# Patient Record
Sex: Male | Born: 1935
Health system: Southern US, Community
[De-identification: ages and names within clinical notes are randomized; demographics above are authoritative.]

## PROBLEM LIST (undated history)

## (undated) DIAGNOSIS — K254 Chronic or unspecified gastric ulcer with hemorrhage: Secondary | ICD-10-CM

## (undated) DIAGNOSIS — C61 Malignant neoplasm of prostate: Secondary | ICD-10-CM

## (undated) DIAGNOSIS — H9190 Unspecified hearing loss, unspecified ear: Secondary | ICD-10-CM

## (undated) HISTORY — PX: BACK SURGERY: SHX140

## (undated) HISTORY — PX: LUMBAR DISC SURGERY: SHX700

---

## 1999-07-13 ENCOUNTER — Emergency Department (HOSPITAL_COMMUNITY): Admission: EM | Admit: 1999-07-13 | Discharge: 1999-07-13 | Payer: Self-pay | Admitting: Emergency Medicine

## 2004-05-25 ENCOUNTER — Ambulatory Visit: Payer: Self-pay

## 2004-06-05 HISTORY — PX: PROSTATECTOMY: SHX69

## 2005-07-24 ENCOUNTER — Ambulatory Visit: Payer: Self-pay | Admitting: Urology

## 2005-07-24 IMAGING — NM NUCLEAR MEDICINE WHOLE BODY BONE SCINTIGRAPHY
2 series · 6 of 6 positions shown · non-contrast
Comparison: none

REASON FOR EXAM: Prostate CA
COMMENTS:

[Series 1: 3 hr wholebody · 2.40mm/px · 2 of 2 frames shown]
[frame 1/2]
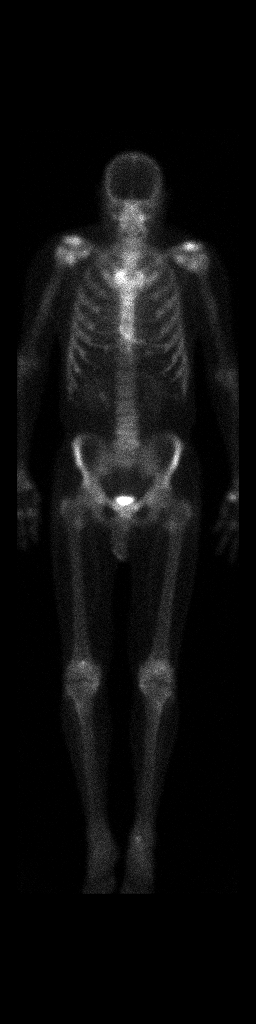
[frame 2/2]
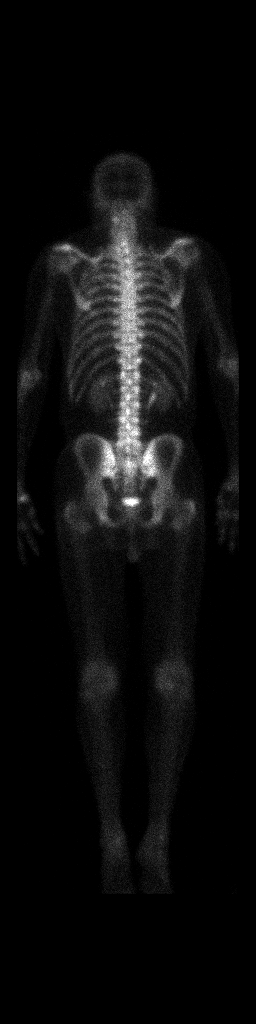

[Series 1: bone statics · 2.40mm/px · 2 acquisitions, 4 frames shown]
[im 1/2]
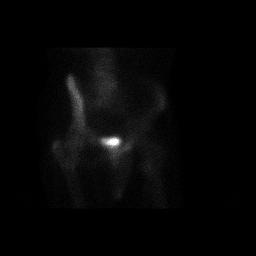
[im 1/2]
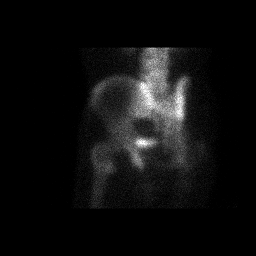
[im 2/2]
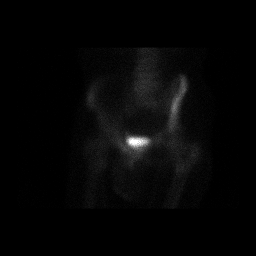
[im 2/2]
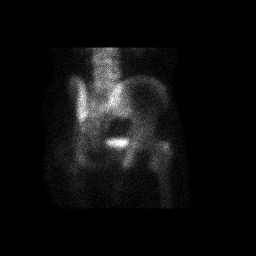

[6 of 6 positions shown; findings below may reference images not displayed]

PROCEDURE:     NM  - NM BONE WB 3 HR [DATE]  [DATE]

RESULT:     A total body bone scan was performed status post intravenous
administration of 21.77 mCi Tc 99m MDP.  Appropriate radiotracer activity is
demonstrated in the region of the RIGHT and LEFT kidneys and urinary
bladder.  Very vague areas of increased osseous remodeling are demonstrated
along the medial malleolar region of the LEFT ankle as well as along the
patellar region of the RIGHT knee.  These areas likely represent the sequela
of degenerative change though can be monitored if and as clinically
warranted. No further evidence of asymmetric osseous remodeling is
demonstrated within the appendicular or axial skeleton.
IMPRESSION: Vague areas of osseous remodeling projecting in the region of the patella on
the RIGHT and within the medial malleolar region of the ankle on the LEFT.
These likely represent the sequela of degenerative change though can be
monitored if and as clinically warranted.

## 2005-08-01 ENCOUNTER — Other Ambulatory Visit: Payer: Self-pay

## 2005-08-07 ENCOUNTER — Ambulatory Visit: Payer: Self-pay | Admitting: Urology

## 2007-02-12 ENCOUNTER — Ambulatory Visit: Payer: Self-pay | Admitting: Gastroenterology

## 2012-06-05 DIAGNOSIS — C61 Malignant neoplasm of prostate: Secondary | ICD-10-CM

## 2012-06-05 HISTORY — DX: Malignant neoplasm of prostate: C61

## 2013-01-24 ENCOUNTER — Ambulatory Visit: Payer: Self-pay | Admitting: Gastroenterology

## 2013-01-27 LAB — PATHOLOGY REPORT

## 2013-03-03 ENCOUNTER — Ambulatory Visit (HOSPITAL_COMMUNITY)
Admission: RE | Admit: 2013-03-03 | Discharge: 2013-03-03 | Disposition: A | Discharge status: Home / Self Care | Payer: Medicare Other | Source: Ambulatory Visit | Attending: Neurosurgery | Admitting: Neurosurgery

## 2013-03-03 ENCOUNTER — Other Ambulatory Visit (HOSPITAL_COMMUNITY): Payer: Self-pay | Admitting: Neurosurgery

## 2013-03-03 DIAGNOSIS — M5412 Radiculopathy, cervical region: Secondary | ICD-10-CM

## 2013-03-03 DIAGNOSIS — M5380 Other specified dorsopathies, site unspecified: Secondary | ICD-10-CM | POA: Insufficient documentation

## 2013-03-03 DIAGNOSIS — R531 Weakness: Secondary | ICD-10-CM

## 2013-03-03 DIAGNOSIS — R29898 Other symptoms and signs involving the musculoskeletal system: Secondary | ICD-10-CM | POA: Insufficient documentation

## 2013-03-10 ENCOUNTER — Other Ambulatory Visit: Payer: Self-pay | Admitting: Neurosurgery

## 2013-03-13 ENCOUNTER — Encounter (HOSPITAL_COMMUNITY): Payer: Self-pay | Admitting: Pharmacy Technician

## 2013-03-14 NOTE — Pre-Procedure Instructions (Signed)
Michael Stone  03/14/2013   Your procedure is scheduled on: Wednesday, October 15th.  Report to [facility], Main Entrance/ntrance "A"at 6:30  Call this number if you have problems the morning of surgery: [phone]   Remember:   Do not eat food or drink liquids after midnight Tuesday, October 14th.  Take these medicines the morning of surgery with A SIP OF WATER: take if needed:diazepam (VALIUM), oxyCODONE-acetaminophen (PERCOCET/ROXICET).  Stop taking Aspirin, Coumadin, Plavix, Effient and Herbal medications.  Do not take any NSAIDs ie: Ibuprofen,  Advil,Naproxen or any medication containing Aspirin.   Do not wear jewelry, make-up or nail polish.  Do not wear lotions, powders, or perfumes. You may wear deodorant.  Do not shave 48 hours prior to surgery. Men may shave face and neck.  Do not bring valuables to the hospital.  [facility] is not responsible for any belongings or valuables.               Contacts, dentures or bridgework may not be worn into surgery.  Leave suitcase in the car. After surgery it may be brought to your room.  For patients admitted to the hospital, discharge time is determined by your treatment team.               Patients discharged the day of surgery will not be allowed to drive home.  Name and phone number of your driver: - `  Special Instructions: Shower using CHG 2 nights before surgery and the night before surgery.  If you shower the day of surgery use CHG.  Use special wash - you have one bottle of CHG for all showers.  You should use approximately 1/3 of the bottle for each shower.   Please read over the following fact sheets that you were given: Pain Booklet, Coughing and Deep Breathing and Surgical Site Infection Prevention

## 2013-03-17 ENCOUNTER — Encounter (HOSPITAL_COMMUNITY)
Admission: RE | Admit: 2013-03-17 | Discharge: 2013-03-17 | Disposition: A | Discharge status: Home / Self Care | Payer: Medicare Other | Source: Ambulatory Visit | Attending: Neurosurgery | Admitting: Neurosurgery

## 2013-03-17 ENCOUNTER — Encounter (HOSPITAL_COMMUNITY): Payer: Self-pay

## 2013-03-17 LAB — BASIC METABOLIC PANEL
CO2: 31 mEq/L (ref 19–32)
Chloride: 99 mEq/L (ref 96–112)
Creatinine, Ser: 1.13 mg/dL (ref 0.50–1.35)
GFR calc Af Amer: 71 mL/min — ABNORMAL LOW (ref >90)
Glucose, Bld: 90 mg/dL (ref 70–99)
Potassium: 3.7 mEq/L (ref 3.5–5.1)
Sodium: 138 mEq/L (ref 135–145)

## 2013-03-17 LAB — SURGICAL PCR SCREEN
MRSA, PCR: POSITIVE — AB
Staphylococcus aureus: POSITIVE — AB

## 2013-03-17 LAB — CBC
Hemoglobin: 15.2 g/dL (ref 13.0–17.0)
Platelets: 353 10*3/uL (ref 150–400)
RDW: 12.4 % (ref 11.5–15.5)
WBC: 8.6 10*3/uL (ref 4.0–10.5)

## 2013-03-17 NOTE — Progress Notes (Signed)
Notified susan at dr. Cassandria Santee office regarding positive nasal swab for mrsa/staph. rx to be called in to [facility].

## 2013-03-18 MED ORDER — CEFAZOLIN SODIUM-DEXTROSE 2-3 GM-% IV SOLR
2.0000 g | INTRAVENOUS | Status: AC
  Administered 2013-03-19: 2 g via INTRAVENOUS
  Filled 2013-03-18: qty 50

## 2013-03-18 NOTE — H&P (Signed)
Michael Stone is an 77 y.o. male.   Chief Complaint: right arm pain HPI: patient seen in my office with the history of neck pain with radiation to the right arm associated with weakness.he has had this for several years and usually goes away with traction.  Past Medical History  Diagnosis Date  . Cancer     prostate    Past Surgical History  Procedure Laterality Date  . Back surgery      lumbar surgery  . Prostatectomy  2006    No family history on file. Social History:  reports that he quit smoking about 30 years ago. His smoking use included Pipe. He has never used smokeless tobacco. He reports that he does not drink alcohol or use illicit drugs.  Allergies: No Known Allergies  No prescriptions prior to admission    Results for orders placed during the hospital encounter of 03/17/13 (from the past 48 hour(s))  SURGICAL PCR SCREEN     Status: Abnormal   Collection Time    03/17/13 10:00 AM      Result Value Range   MRSA, PCR POSITIVE (*) NEGATIVE   Staphylococcus aureus POSITIVE (*) NEGATIVE   Comment:            The Xpert SA Assay (FDA     approved for NASAL specimens     in patients over 54 years of age),     is one component of     a comprehensive surveillance     program.  Test performance has     been validated by [employer] for patients greater     than or equal to 75 year old.     It is not intended     to diagnose infection nor to     guide or monitor treatment.  CBC     Status: None   Collection Time    03/17/13 10:00 AM      Result Value Range   WBC 8.6  4.0 - 10.5 K/uL   RBC 5.16  4.22 - 5.81 MIL/uL   Hemoglobin 15.2  13.0 - 17.0 g/dL   HCT 95.6  21.3 - 08.6 %   MCV 86.4  78.0 - 100.0 fL   MCH 29.5  26.0 - 34.0 pg   MCHC 34.1  30.0 - 36.0 g/dL   RDW 57.8  46.9 - 62.9 %   Platelets 353  150 - 400 K/uL  BASIC METABOLIC PANEL     Status: Abnormal   Collection Time    03/17/13 10:00 AM      Result Value Range   Sodium 138  135 - 145 mEq/L   Potassium 3.7  3.5 - 5.1 mEq/L   Chloride 99  96 - 112 mEq/L   CO2 31  19 - 32 mEq/L   Glucose, Bld 90  70 - 99 mg/dL   BUN 15  6 - 23 mg/dL   Creatinine, Ser 5.28  0.50 - 1.35 mg/dL   Calcium 9.3  8.4 - 41.3 mg/dL   GFR calc non Af Amer 62 (*) >90 mL/min   GFR calc Af Amer 71 (*) >90 mL/min   Comment: (NOTE)     The eGFR has been calculated using the CKD EPI equation.     This calculation has not been validated in all clinical situations.     eGFR's persistently <90 mL/min signify possible Chronic Kidney     Disease.   No results found.  Review of Systems  Constitutional: Negative.   HENT: Positive for hearing loss.   Eyes: Negative.   Respiratory: Negative.   Cardiovascular: Negative.   Genitourinary: Negative.   Musculoskeletal: Positive for neck pain.  Skin: Negative.   Neurological: Positive for sensory change and focal weakness.  Endo/Heme/Allergies: Negative.   Psychiatric/Behavioral: Negative.     There were no vitals taken for this visit. Physical Exam hent, nl. Hearing aid. Neck, rotation gives pain to shoulders. Cv, nl. Lungs, clear. Abdomen, soft. Extremities, nl. NEURO,  Right deltoids and biceps are 2/5. Sensory nl. Mri shows multiple levels of ddd from c34 to 67. c23 and c7t1 are borderline.   Assessment/Plan Patient di not want more conservative treatment. He will be having decompression and fusion from c3 to  67. He and his wife are aware of risks and benefits  Michael Stone M 03/18/2013, 6:11 PM

## 2013-03-19 ENCOUNTER — Encounter (HOSPITAL_COMMUNITY): Payer: Medicare Other | Admitting: Anesthesiology

## 2013-03-19 ENCOUNTER — Inpatient Hospital Stay (HOSPITAL_COMMUNITY): Payer: Medicare Other | Admitting: Anesthesiology

## 2013-03-19 ENCOUNTER — Encounter (HOSPITAL_COMMUNITY)
Admission: RE | Disposition: A | Discharge status: Home / Self Care | Payer: Self-pay | Source: Ambulatory Visit | Attending: Neurosurgery

## 2013-03-19 ENCOUNTER — Encounter (HOSPITAL_COMMUNITY): Payer: Self-pay | Admitting: *Deleted

## 2013-03-19 ENCOUNTER — Inpatient Hospital Stay (HOSPITAL_COMMUNITY): Payer: Medicare Other

## 2013-03-19 ENCOUNTER — Inpatient Hospital Stay (HOSPITAL_COMMUNITY)
Admission: RE | Admit: 2013-03-19 | Discharge: 2013-03-21 | DRG: 473 | Disposition: A | Discharge status: Home / Self Care | Payer: Medicare Other | Source: Ambulatory Visit | Attending: Neurosurgery | Admitting: Neurosurgery

## 2013-03-19 DIAGNOSIS — Z01812 Encounter for preprocedural laboratory examination: Secondary | ICD-10-CM

## 2013-03-19 DIAGNOSIS — Z8546 Personal history of malignant neoplasm of prostate: Secondary | ICD-10-CM

## 2013-03-19 DIAGNOSIS — R29898 Other symptoms and signs involving the musculoskeletal system: Secondary | ICD-10-CM | POA: Diagnosis present

## 2013-03-19 DIAGNOSIS — M5030 Other cervical disc degeneration, unspecified cervical region: Principal | ICD-10-CM | POA: Diagnosis present

## 2013-03-19 DIAGNOSIS — Z87891 Personal history of nicotine dependence: Secondary | ICD-10-CM

## 2013-03-19 HISTORY — DX: Malignant neoplasm of prostate: C61

## 2013-03-19 HISTORY — PX: ANTERIOR CERVICAL DECOMPRESSION/DISCECTOMY FUSION 4 LEVELS: SHX5556

## 2013-03-19 HISTORY — PX: ANTERIOR CERVICAL DECOMP/DISCECTOMY FUSION: SHX1161

## 2013-03-19 HISTORY — DX: Unspecified hearing loss, unspecified ear: H91.90

## 2013-03-19 HISTORY — DX: Chronic or unspecified gastric ulcer with hemorrhage: K25.4

## 2013-03-19 SURGERY — ANTERIOR CERVICAL DECOMPRESSION/DISCECTOMY FUSION 4 LEVELS
Anesthesia: General | Site: Neck | Wound class: Clean

## 2013-03-19 MED ORDER — SODIUM CHLORIDE 0.9 % IV SOLN
INTRAVENOUS | Status: DC
  Administered 2013-03-19: 17:00:00 via INTRAVENOUS

## 2013-03-19 MED ORDER — 0.9 % SODIUM CHLORIDE (POUR BTL) OPTIME
TOPICAL | Status: DC | PRN
  Administered 2013-03-19: 1000 mL

## 2013-03-19 MED ORDER — SODIUM CHLORIDE 0.9 % IV SOLN: 250.0000 mL | INTRAVENOUS | Status: DC

## 2013-03-19 MED ORDER — GLYCOPYRROLATE 0.2 MG/ML IJ SOLN
INTRAMUSCULAR | Status: DC | PRN
  Administered 2013-03-19: 0.4 mg via INTRAVENOUS

## 2013-03-19 MED ORDER — SODIUM CHLORIDE 0.9 % IJ SOLN: 3.0000 mL | INTRAMUSCULAR | Status: DC | PRN

## 2013-03-19 MED ORDER — PHENOL 1.4 % MT LIQD: 1.0000 | OROMUCOSAL | Status: DC | PRN

## 2013-03-19 MED ORDER — DEXAMETHASONE SODIUM PHOSPHATE 4 MG/ML IJ SOLN
4.0000 mg | Freq: Four times a day (QID) | INTRAMUSCULAR | Status: DC
  Administered 2013-03-19: 4 mg via INTRAVENOUS
  Filled 2013-03-19 (×9): qty 1

## 2013-03-19 MED ORDER — ONDANSETRON HCL 4 MG/2ML IJ SOLN
INTRAMUSCULAR | Status: DC | PRN
  Administered 2013-03-19: 4 mg via INTRAMUSCULAR

## 2013-03-19 MED ORDER — ALBUMIN HUMAN 5 % IV SOLN
INTRAVENOUS | Status: DC | PRN
  Administered 2013-03-19: 11:00:00 via INTRAVENOUS

## 2013-03-19 MED ORDER — PHENYLEPHRINE HCL 10 MG/ML IJ SOLN
10.0000 mg | INTRAVENOUS | Status: DC | PRN
  Administered 2013-03-19: 20 ug/min via INTRAVENOUS

## 2013-03-19 MED ORDER — LACTATED RINGERS IV SOLN
INTRAVENOUS | Status: DC | PRN
  Administered 2013-03-19 (×2): via INTRAVENOUS

## 2013-03-19 MED ORDER — CEFAZOLIN SODIUM 1-5 GM-% IV SOLN
1.0000 g | Freq: Three times a day (TID) | INTRAVENOUS | Status: AC
  Administered 2013-03-19 – 2013-03-20 (×2): 1 g via INTRAVENOUS
  Filled 2013-03-19 (×3): qty 50

## 2013-03-19 MED ORDER — DEXAMETHASONE SODIUM PHOSPHATE 4 MG/ML IJ SOLN
INTRAMUSCULAR | Status: DC | PRN
  Administered 2013-03-19: 8 mg via INTRAVENOUS

## 2013-03-19 MED ORDER — ONDANSETRON HCL 4 MG/2ML IJ SOLN: 4.0000 mg | INTRAMUSCULAR | Status: DC | PRN

## 2013-03-19 MED ORDER — DIAZEPAM 5 MG PO TABS: 5.0000 mg | ORAL_TABLET | Freq: Four times a day (QID) | ORAL | Status: DC | PRN

## 2013-03-19 MED ORDER — ACETAMINOPHEN 325 MG PO TABS: 650.0000 mg | ORAL_TABLET | ORAL | Status: DC | PRN

## 2013-03-19 MED ORDER — ARTIFICIAL TEARS OP OINT
TOPICAL_OINTMENT | OPHTHALMIC | Status: DC | PRN
  Administered 2013-03-19: 1 via OPHTHALMIC

## 2013-03-19 MED ORDER — DEXAMETHASONE 4 MG PO TABS
4.0000 mg | ORAL_TABLET | Freq: Four times a day (QID) | ORAL | Status: DC
  Administered 2013-03-20 – 2013-03-21 (×6): 4 mg via ORAL
  Filled 2013-03-19 (×11): qty 1

## 2013-03-19 MED ORDER — MORPHINE SULFATE 2 MG/ML IJ SOLN
1.0000 mg | INTRAMUSCULAR | Status: DC | PRN
  Administered 2013-03-19: 2 mg via INTRAVENOUS
  Filled 2013-03-19: qty 1

## 2013-03-19 MED ORDER — MENTHOL 3 MG MT LOZG: 1.0000 | LOZENGE | OROMUCOSAL | Status: DC | PRN

## 2013-03-19 MED ORDER — HYDROMORPHONE HCL PF 1 MG/ML IJ SOLN
0.2500 mg | INTRAMUSCULAR | Status: DC | PRN
  Administered 2013-03-19: 0.5 mg via INTRAVENOUS

## 2013-03-19 MED ORDER — OXYCODONE-ACETAMINOPHEN 5-325 MG PO TABS
1.0000 | ORAL_TABLET | ORAL | Status: DC | PRN
  Administered 2013-03-19 – 2013-03-20 (×2): 2 via ORAL
  Filled 2013-03-19 (×2): qty 2

## 2013-03-19 MED ORDER — HYDROMORPHONE HCL PF 1 MG/ML IJ SOLN
INTRAMUSCULAR | Status: AC
  Filled 2013-03-19: qty 1

## 2013-03-19 MED ORDER — THROMBIN 5000 UNITS EX SOLR
OROMUCOSAL | Status: DC | PRN
  Administered 2013-03-19: 10:00:00 via TOPICAL

## 2013-03-19 MED ORDER — FENTANYL CITRATE 0.05 MG/ML IJ SOLN
INTRAMUSCULAR | Status: DC | PRN
  Administered 2013-03-19: 50 ug via INTRAVENOUS
  Administered 2013-03-19: 100 ug via INTRAVENOUS
  Administered 2013-03-19 (×2): 50 ug via INTRAVENOUS

## 2013-03-19 MED ORDER — NEOSTIGMINE METHYLSULFATE 1 MG/ML IJ SOLN
INTRAMUSCULAR | Status: DC | PRN
  Administered 2013-03-19: 3 mg via INTRAVENOUS

## 2013-03-19 MED ORDER — LIDOCAINE HCL (CARDIAC) 20 MG/ML IV SOLN
INTRAVENOUS | Status: DC | PRN
  Administered 2013-03-19: 100 mg via INTRAVENOUS

## 2013-03-19 MED ORDER — SODIUM CHLORIDE 0.9 % IJ SOLN
3.0000 mL | Freq: Two times a day (BID) | INTRAMUSCULAR | Status: DC
  Administered 2013-03-19 – 2013-03-20 (×3): 3 mL via INTRAVENOUS

## 2013-03-19 MED ORDER — ACETAMINOPHEN 650 MG RE SUPP: 650.0000 mg | RECTAL | Status: DC | PRN

## 2013-03-19 MED ORDER — ROCURONIUM BROMIDE 100 MG/10ML IV SOLN
INTRAVENOUS | Status: DC | PRN
  Administered 2013-03-19: 35 mg via INTRAVENOUS
  Administered 2013-03-19: 10 mg via INTRAVENOUS
  Administered 2013-03-19 (×2): 20 mg via INTRAVENOUS
  Administered 2013-03-19: 15 mg via INTRAVENOUS

## 2013-03-19 MED ORDER — ONDANSETRON HCL 4 MG/2ML IJ SOLN: 4.0000 mg | Freq: Once | INTRAMUSCULAR | Status: DC | PRN

## 2013-03-19 MED ORDER — PROPOFOL 10 MG/ML IV BOLUS
INTRAVENOUS | Status: DC | PRN
  Administered 2013-03-19: 200 mg via INTRAVENOUS

## 2013-03-19 MED ORDER — THROMBIN 20000 UNITS EX SOLR
CUTANEOUS | Status: DC | PRN
  Administered 2013-03-19: 10:00:00 via TOPICAL

## 2013-03-19 SURGICAL SUPPLY — 56 items
BANDAGE GAUZE ELAST BULKY 4 IN (GAUZE/BANDAGES/DRESSINGS) ×4 IMPLANT
BENZOIN TINCTURE PRP APPL 2/3 (GAUZE/BANDAGES/DRESSINGS) ×2 IMPLANT
BIT DRILL SPINE QC 14 (BIT) ×2 IMPLANT
BLADE ULTRA TIP 2M (BLADE) ×2 IMPLANT
BUR BARREL STRAIGHT FLUTE 4.0 (BURR) IMPLANT
BUR MATCHSTICK NEURO 3.0 LAGG (BURR) ×2 IMPLANT
CANISTER SUCTION 2500CC (MISCELLANEOUS) ×2 IMPLANT
CONT SPEC 4OZ CLIKSEAL STRL BL (MISCELLANEOUS) ×2 IMPLANT
COVER MAYO STAND STRL (DRAPES) ×2 IMPLANT
DERMABOND ADHESIVE PROPEN (GAUZE/BANDAGES/DRESSINGS) ×1
DERMABOND ADVANCED .7 DNX6 (GAUZE/BANDAGES/DRESSINGS) ×1 IMPLANT
DRAIN JACKSON PRATT 10MM FLAT (MISCELLANEOUS) ×2 IMPLANT
DRAPE C-ARM 42X72 X-RAY (DRAPES) ×4 IMPLANT
DRAPE LAPAROTOMY 100X72 PEDS (DRAPES) ×2 IMPLANT
DRAPE MICROSCOPE LEICA (MISCELLANEOUS) ×2 IMPLANT
DRAPE POUCH INSTRU U-SHP 10X18 (DRAPES) ×2 IMPLANT
DRAPE PROXIMA HALF (DRAPES) IMPLANT
DURAPREP 6ML APPLICATOR 50/CS (WOUND CARE) ×2 IMPLANT
ELECT REM PT RETURN 9FT ADLT (ELECTROSURGICAL) ×2
ELECTRODE REM PT RTRN 9FT ADLT (ELECTROSURGICAL) ×1 IMPLANT
EVACUATOR SILICONE 100CC (DRAIN) ×2 IMPLANT
GAUZE SPONGE 4X4 16PLY XRAY LF (GAUZE/BANDAGES/DRESSINGS) ×2 IMPLANT
GLOVE BIOGEL M 8.0 STRL (GLOVE) ×2 IMPLANT
GLOVE ECLIPSE 8.0 STRL XLNG CF (GLOVE) ×6 IMPLANT
GLOVE EXAM NITRILE LRG STRL (GLOVE) IMPLANT
GLOVE EXAM NITRILE MD LF STRL (GLOVE) IMPLANT
GLOVE EXAM NITRILE XL STR (GLOVE) IMPLANT
GLOVE EXAM NITRILE XS STR PU (GLOVE) IMPLANT
GLOVE INDICATOR 8.5 STRL (GLOVE) ×6 IMPLANT
GOWN BRE IMP SLV AUR LG STRL (GOWN DISPOSABLE) ×2 IMPLANT
GOWN BRE IMP SLV AUR XL STRL (GOWN DISPOSABLE) IMPLANT
GOWN STRL REIN 2XL LVL4 (GOWN DISPOSABLE) ×4 IMPLANT
HEAD HALTER (SOFTGOODS) ×2 IMPLANT
HEMOSTAT POWDER KIT SURGIFOAM (HEMOSTASIS) IMPLANT
KIT BASIN OR (CUSTOM PROCEDURE TRAY) ×2 IMPLANT
KIT ROOM TURNOVER OR (KITS) ×2 IMPLANT
NEEDLE SPNL 22GX3.5 QUINCKE BK (NEEDLE) ×2 IMPLANT
NS IRRIG 1000ML POUR BTL (IV SOLUTION) ×2 IMPLANT
PACK LAMINECTOMY NEURO (CUSTOM PROCEDURE TRAY) ×2 IMPLANT
PATTIES SURGICAL .5 X1 (DISPOSABLE) ×2 IMPLANT
PUTTY DBX 1CC (Putty) ×2 IMPLANT
PUTTY DBX 1CC DEPUY (Putty) ×1 IMPLANT
RUBBERBAND STERILE (MISCELLANEOUS) ×4 IMPLANT
SCREW XTD VAR 4.2 SELF TAP (Screw) ×20 IMPLANT
SPACER ACDF SM LORDOTIC 7 (Spacer) ×6 IMPLANT
SPACER COLONIAL SZ 6-7 (Spacer) ×2 IMPLANT
SPONGE GAUZE 4X4 12PLY (GAUZE/BANDAGES/DRESSINGS) ×2 IMPLANT
SPONGE INTESTINAL PEANUT (DISPOSABLE) ×4 IMPLANT
SPONGE SURGIFOAM ABS GEL 100 (HEMOSTASIS) ×2 IMPLANT
STRIP CLOSURE SKIN 1/2X4 (GAUZE/BANDAGES/DRESSINGS) ×2 IMPLANT
SUT VIC AB 3-0 SH 8-18 (SUTURE) ×2 IMPLANT
SYR 20ML ECCENTRIC (SYRINGE) ×2 IMPLANT
TAPE CLOTH SURG 4X10 WHT LF (GAUZE/BANDAGES/DRESSINGS) ×2 IMPLANT
TOWEL OR 17X24 6PK STRL BLUE (TOWEL DISPOSABLE) ×2 IMPLANT
TOWEL OR 17X26 10 PK STRL BLUE (TOWEL DISPOSABLE) ×2 IMPLANT
WATER STERILE IRR 1000ML POUR (IV SOLUTION) ×2 IMPLANT

## 2013-03-19 NOTE — Transfer of Care (Signed)
Immediate Anesthesia Transfer of Care Note  Patient: Michael Stone  Procedure(s) Performed: Procedure(s) with comments: Cervical Three-Four, Cervical Four-Five, Cervical Five-Six, Cervical Six-Seven anteiror cervical decompression with fusion with plating and bonegraft (N/A) - ANTERIOR CERVICAL DECOMPRESSION/DISCECTOMY FUSION 4 LEVELS  Patient Location: PACU  Anesthesia Type:General  Level of Consciousness: awake, alert  and oriented  Airway & Oxygen Therapy: Patient Spontanous Breathing and Patient connected to nasal cannula oxygen  Post-op Assessment: Report given to PACU RN, Post -op Vital signs reviewed and stable and Patient moving all extremities X 4  Post vital signs: Reviewed and stable  Complications: No apparent anesthesia complications

## 2013-03-19 NOTE — Progress Notes (Signed)
Op note [id number]

## 2013-03-19 NOTE — Anesthesia Preprocedure Evaluation (Addendum)
Anesthesia Evaluation  Patient identified by MRN, date of birth, ID band Patient awake    Reviewed: Allergy & Precautions, H&P , NPO status , Patient's Chart, lab work & pertinent test results  History of Anesthesia Complications Negative for: history of anesthetic complications  Airway Mallampati: II TM Distance: >3 FB Neck ROM: Full    Dental  (+) Poor Dentition and Dental Advisory Given   Pulmonary          Cardiovascular     Neuro/Psych    GI/Hepatic   Endo/Other    Renal/GU      Musculoskeletal   Abdominal   Peds  Hematology   Anesthesia Other Findings   Reproductive/Obstetrics                          Anesthesia Physical Anesthesia Plan  ASA: I  Anesthesia Plan: General   Post-op Pain Management:    Induction: Intravenous  Airway Management Planned: Oral ETT  Additional Equipment:   Intra-op Plan:   Post-operative Plan: Extubation in OR  Informed Consent: I have reviewed the patients History and Physical, chart, labs and discussed the procedure including the risks, benefits and alternatives for the proposed anesthesia with the patient or authorized representative who has indicated his/her understanding and acceptance.   Dental advisory given  Plan Discussed with: CRNA, Anesthesiologist and Surgeon  Anesthesia Plan Comments:        Anesthesia Quick Evaluation

## 2013-03-19 NOTE — Anesthesia Procedure Notes (Signed)
Procedure Name: Intubation Date/Time: 03/19/2013 8:53 AM Performed by: Gayla Medicus Pre-anesthesia Checklist: Patient identified, Timeout performed, Emergency Drugs available, Suction available and Patient being monitored Patient Re-evaluated:Patient Re-evaluated prior to inductionOxygen Delivery Method: Circle system utilized Preoxygenation: Pre-oxygenation with 100% oxygen Intubation Type: IV induction Ventilation: Mask ventilation without difficulty and Oral airway inserted - appropriate to patient size Laryngoscope Size: Mac and 3 Grade View: Grade II Tube type: Oral Tube size: 7.5 mm Number of attempts: 1 Airway Equipment and Method: Stylet Placement Confirmation: ETT inserted through vocal cords under direct vision,  positive ETCO2 and breath sounds checked- equal and bilateral Secured at: 23 cm Dental Injury: Teeth and Oropharynx as per pre-operative assessment

## 2013-03-19 NOTE — Progress Notes (Signed)
Orthopedic Tech Progress Note Patient Details:  Michael Stone [birth year] [id number]  Ortho Devices Type of Ortho Device: Soft collar Ortho Device/Splint Interventions: Application   Shawnie Pons 03/19/2013, 12:44 PM

## 2013-03-19 NOTE — Progress Notes (Signed)
UR COMPLETED  

## 2013-03-19 NOTE — Preoperative (Signed)
Beta Blockers   Reason not to administer Beta Blockers:Not Applicable 

## 2013-03-19 NOTE — Anesthesia Postprocedure Evaluation (Signed)
  Anesthesia Post-op Note  Patient: Michael Stone  Procedure(s) Performed: Procedure(s) with comments: Cervical Three-Four, Cervical Four-Five, Cervical Five-Six, Cervical Six-Seven anteiror cervical decompression with fusion with plating and bonegraft (N/A) - ANTERIOR CERVICAL DECOMPRESSION/DISCECTOMY FUSION 4 LEVELS  Patient Location: PACU  Anesthesia Type:General  Level of Consciousness: awake, alert , oriented and patient cooperative  Airway and Oxygen Therapy: Patient Spontanous Breathing  Post-op Pain: mild  Post-op Assessment: Post-op Vital signs reviewed, Patient's Cardiovascular Status Stable, Respiratory Function Stable, Patent Airway, No signs of Nausea or vomiting and Pain level controlled  Post-op Vital Signs: stable  Complications: No apparent anesthesia complications

## 2013-03-20 ENCOUNTER — Encounter (HOSPITAL_COMMUNITY): Payer: Self-pay | Admitting: Neurosurgery

## 2013-03-20 NOTE — Progress Notes (Signed)
Patient ID: Michael Stone, male   DOB: 1935-11-25, 77 y.o.   MRN: [id number] Doing well. No pain, no weakness. oob  Drain in place

## 2013-03-20 NOTE — Evaluation (Signed)
Physical Therapy Evaluation Patient Details Name: Michael Stone MRN: [id number] DOB: [birth year] Today's Date: 03/20/2013 Time: 4098-1191 PT Time Calculation (min): 12 min  PT Assessment / Plan / Recommendation History of Present Illness  Pt is a 77 y.o. male s/p Cervical Three-Four, Cervical Four-Five, Cervical Five-Six, Cervical Six-Seven anteiror cervical decompression with fusion with plating and bonegraft   Clinical Impression  Patient independent with ambulation and stair negotiation, no acute PT needs, will sign off.    PT Assessment  Patent does not need any further PT services    Follow Up Recommendations  No PT follow up    Does the patient have the potential to tolerate intense rehabilitation      Barriers to Discharge        Equipment Recommendations  None recommended by PT    Recommendations for Other Services     Frequency      Precautions / Restrictions Precautions Precautions: Cervical Required Braces or Orthoses: Cervical Brace Cervical Brace: Soft collar   Pertinent Vitals/Pain Reports no pain      Mobility  Bed Mobility Bed Mobility: Not assessed Ambulation/Gait Ambulation/Gait Assistance: 7: Independent Ambulation Distance (Feet): 300 Feet Assistive device: None Ambulation/Gait Assistance Details: steady with ambulation Gait Pattern: Within Functional Limits Gait velocity: decreased and guarded but steady Stairs: Yes Stairs Assistance: 7: Independent Stair Management Technique: One rail Right;Alternating pattern;Forwards Number of Stairs: 5 (performed x 3)       PT Goals(Current goals can be found in the care plan section) Acute Rehab PT Goals PT Goal Formulation: No goals set, d/c therapy  Visit Information  Last PT Received On: 03/20/13 Assistance Needed: +1 History of Present Illness: Pt is a 77 y.o. male s/p Cervical Three-Four, Cervical Four-Five, Cervical Five-Six, Cervical Six-Seven anteiror cervical decompression with fusion  with plating and bonegraft        Prior Functioning  Home Living Family/patient expects to be discharged to:: Private residence Living Arrangements: Spouse/significant other Available Help at Discharge: Family;Available 24 hours/day Type of Home: House Home Access: Stairs to enter [employer] of Steps: 4 Entrance Stairs-Rails: Right Home Layout: One level Home Equipment: None Prior Function Level of Independence: Independent Comments: driving a car Communication Communication: HOH Dominant Hand: Right    Cognition  Cognition Arousal/Alertness: Awake/alert Behavior During Therapy: WFL for tasks assessed/performed    Extremity/Trunk Assessment Upper Extremity Assessment Upper Extremity Assessment: RUE deficits/detail RUE Deficits / Details: pt reports numbness in dorsal aspect of the thumb only. Pt with decr grasp 3+ out 5 compared to left UE RUE Coordination: decreased fine motor Lower Extremity Assessment Lower Extremity Assessment: Overall WFL for tasks assessed Cervical / Trunk Assessment Cervical / Trunk Assessment: Other exceptions (surg with precautions)   Balance High Level Balance High Level Balance Activites: Side stepping;Backward walking;Direction changes;Turns High Level Balance Comments: steady   End of Session PT - End of Session Equipment Utilized During Treatment: Gait belt;Cervical collar Activity Tolerance: Patient tolerated treatment well Patient left: Other (comment) (working with OT) Nurse Communication: Mobility status  GP     Fabio Asa 03/20/2013, 11:19 AM Charlotte Crumb, PT DPT  [phone]

## 2013-03-20 NOTE — Op Note (Signed)
NAME:  WYLAN, GENTZLER NO.:  [id number]  MEDICAL RECORD NO.:  [id number]  LOCATION:  [id number]                        FACILITY:  [facility]  PHYSICIAN:  Hilda Lias, M.D.   DATE OF BIRTH:  Aug 11, 1935  DATE OF PROCEDURE:  03/19/2013 DATE OF DISCHARGE:                              OPERATIVE REPORT   PREOPERATIVE DIAGNOSIS:  Cervical stenosis with chronic radiculopathy. Right worse than the left one.  POSTOPERATIVE DIAGNOSIS:  Cervical stenosis with chronic radiculopathy. Right worse than the left one.  PROCEDURE:  Anterior 3-4, 4-5, 5-6, 6-7 diskectomy, decompression of the spinal cord, bilateral foraminotomy, interbody fusion with cage, plate, microscope.  SURGEON:  Hilda Lias, M.D.  ASSISTANT:  Coletta Memos, M.D.  CLINICAL HISTORY:  The patient was seen in my office complaining of neck pain worsened to the right upper extremity, which had been going on for several months.  This patient had the same problem in the past, it usually goes away with pain medication.  This time he is getting worse. X-ray shows stenosis at the level from C3-C6, C7 and borderline between 2-3 and C7, thoracic 1.  We advised conservative treatment, but the patient tells me that the pain was getting worse, the weakness was getting worse, and he wants to proceed with surgery.  PROCEDURE:  The patient was taken to the OR.  After intubation, the left side of the neck was cleaned with DuraPrep.  Drapes were applied. Longitudinal incision through the skin, subcutaneous tissue, and platysma was carried out.  We identified the cervical spine.  The first x-ray showed that indeed we were at the level of 4-5.  We brought the microscope into the area.  We opened the anterior ligament between C3- C4.  Degenerative disk disease was removed with decompression of the spinal cord and the foramen.  At the level of 4-5, 5-6, 6-7, the situation was worse.  The patient had surgery on the degenerative  disk then the only way to get into the posterior ligament was drilling the disk as well as the posterior ligament.  Decompression was made.  We decompressed with plenty of space at the end for the C5, C6, and C7 nerve root.  The endplate were drilled.  The area was irrigated.  With hook, we drilled the foramen bilaterally as well as the central canal and there was plenty of space.  Then after we removed the endplate, cage of 6 mm height, lordotic with autograft and DBX was inserted at the level of C4.  At the level of 5-6, the cages were 7 mm with DBX and autograft.  This was followed by a plate using thin screws.  Lateral cervical spine x-rays showed good position of the plate and the upper part.  The area was irrigated and drain was placed and the wound was closed with Vicryl and Steri-Strips.          ______________________________ Hilda Lias, M.D.     EB/MEDQ  D:  03/19/2013  T:  03/20/2013  Job:  [id number]

## 2013-03-20 NOTE — Progress Notes (Signed)
Pt walked the around hallway with a walker with stand by assist. Pt assisted back to bed; placed comfortably in bed. Will continue to monitor pt.

## 2013-03-20 NOTE — Progress Notes (Signed)
Chaplain stopped in to visit with pt upon walking by the room and seeing pt with wife.  Chaplain introduced himself as the unit chaplain and began a conversation with the pt and wife.  Chaplain shared in prayer with the family but also provided empathetic listening and emotional support.  Follow up if needed/requested    03/20/13 1430  Clinical Encounter Type  Visited With Patient and family together  Visit Type Initial;Spiritual support  Consult/Referral To Chaplain  Spiritual Encounters  Spiritual Needs Emotional;Prayer  Stress Factors  Patient Stress Factors None identified  Family Stress Factors None identified  CH Sheral Apley

## 2013-03-20 NOTE — Evaluation (Signed)
Occupational Therapy Evaluation Patient Details Name: Michael Stone MRN: [id number] DOB: Aug 06, 1935 Today's Date: 03/20/2013 Time: 5284-1324 OT Time Calculation (min): 30 min  OT Assessment / Plan / Recommendation History of present illness Pt is a 77 y.o. male s/p Cervical Three-Four, Cervical Four-Five, Cervical Five-Six, Cervical Six-Seven anteiror cervical decompression with fusion with plating and bonegraft    Clinical Impression   Patient evaluated by Occupational Therapy with no further acute OT needs identified. All education has been completed and the patient has no further questions. See below for any follow-up Occupational Therapy or equipment needs. OT to sign off. Thank you for referral.      OT Assessment   (all further work up to be ordered by MD for Rt UE)    Follow Up Recommendations  No OT follow up    Barriers to Discharge      Equipment Recommendations  None recommended by OT    Recommendations for Other Services    Frequency       Precautions / Restrictions Precautions Precautions: Cervical Precaution Comments: handout provided and pt educated. Pt does not have glasses present so he plans to review once wife arrives with glasses Required Braces or Orthoses: Cervical Brace Cervical Brace: Soft collar   Pertinent Vitals/Pain None No pain during entire session    ADL  Grooming: Wash/dry hands;Wash/dry face;Teeth care;Modified independent Where Assessed - Grooming: Unsupported standing Lower Body Dressing: Modified independent Where Assessed - Lower Body Dressing: Unsupported sit to stand Toilet Transfer: Modified independent Toilet Transfer Method: Sit to stand Toilet Transfer Equipment: Raised toilet seat with arms (or 3-in-1 over toilet) Tub/Shower Transfer: Maximal assistance (pt getting down into the bottom of the tub) Tub/Shower Transfer Method: Ambulating Equipment Used: Gait belt Transfers/Ambulation Related to ADLs: pt ambulated MOD i  ADL  Comments: pt educated on cervical precautions with adls. pt completed grooming sink levle and tub transfer. Pt will have (A) of wife at d/c. pt able to recall avoiding flexion/ extension exercise.    OT Diagnosis:    OT Problem List:   OT Treatment Interventions:     OT Goals(Current goals can be found in the care plan section)    Visit Information  Last OT Received On: 03/20/13 Assistance Needed: +1 History of Present Illness: Pt is a 77 y.o. male s/p Cervical Three-Four, Cervical Four-Five, Cervical Five-Six, Cervical Six-Seven anteiror cervical decompression with fusion with plating and bonegraft        Prior Functioning     Home Living Family/patient expects to be discharged to:: Private residence Living Arrangements: Spouse/significant other Available Help at Discharge: Family;Available 24 hours/day Type of Home: House Home Access: Stairs to enter [employer] of Steps: 4 Entrance Stairs-Rails: Right Home Layout: One level Home Equipment: Grab bars - tub/shower Prior Function Level of Independence: Independent Comments: driving a car Communication Communication: HOH Dominant Hand: Right         Vision/Perception Vision - History Baseline Vision: Wears glasses all the time Patient Visual Report: No change from baseline   Cognition  Cognition Arousal/Alertness: Awake/alert Behavior During Therapy: WFL for tasks assessed/performed    Extremity/Trunk Assessment Upper Extremity Assessment Upper Extremity Assessment: RUE deficits/detail RUE Deficits / Details: pt reports numbness in dorsal aspect of the thumb only. Pt with decr grasp 3+ out 5 compared to left UE RUE Coordination: decreased fine motor Lower Extremity Assessment Lower Extremity Assessment: Overall WFL for tasks assessed Cervical / Trunk Assessment Cervical / Trunk Assessment: Other exceptions (surg with precautions)  Mobility Bed Mobility Bed Mobility: Not  assessed Transfers Transfers: Sit to Stand;Stand to Sit Sit to Stand: 6: Modified independent (Device/Increase time) Stand to Sit: With upper extremity assist;To chair/3-in-1;6: Modified independent (Device/Increase time)     Exercise     Balance High Level Balance High Level Balance Activites: Side stepping;Backward walking;Direction changes;Turns High Level Balance Comments: steady    End of Session OT - End of Session Activity Tolerance: Patient tolerated treatment well Patient left: in chair;with call bell/phone within reach;with nursing/sitter in room  GO     [name] 03/20/2013, 1:02 PM Pager: [phone]

## 2013-03-21 NOTE — Discharge Summary (Signed)
Physician Discharge Summary  Patient ID: Michael Stone MRN: [id number] DOB/AGE: [birth year] 77 y.o.  Admit date: 03/19/2013 Discharge date: 03/21/2013  Admission Diagnoses:cervical stenosis 3 to 7  Discharge Diagnoses: same Active Problems:   * No active hospital problems. *   Discharged Condition: no pain  Hospital Course: surgery  Consult none  Significant Diagnostic Studies: mri  Treatments: c3 to 7 fusion  Discharge Exam: Blood pressure 146/83, pulse 88, temperature 97.6 F (36.4 C), temperature source Oral, resp. rate 18, height 6' (1.829 m), weight 74.39 kg (164 lb), SpO2 98.00%. No weakness.  Disposition: home. To see me in 3 weeks     Medication List    ASK your doctor about these medications       diazepam 5 MG tablet  Commonly known as:  VALIUM  Take 5 mg by mouth every 6 (six) hours as needed (spasms).     oxyCODONE-acetaminophen 5-325 MG per tablet  Commonly known as:  PERCOCET/ROXICET  Take 1 tablet by mouth every 4 (four) hours as needed for pain.         Signed: Karn Cassis 03/21/2013, 10:53 AM

## 2013-04-01 NOTE — Op Note (Signed)
NAME:  SHAWNMICHAEL, PARENTEAU NO.:  [id number]  MEDICAL RECORD NO.:  [id number]  LOCATION:  [id number]                        FACILITY:  [facility]  PHYSICIAN:  Hilda Lias, M.D.   DATE OF BIRTH:  01/11/1936  DATE OF PROCEDURE:  03/19/2013 DATE OF DISCHARGE:  03/21/2013                              OPERATIVE REPORT   PREOPERATIVE DIAGNOSIS:  Cervical stenosis with chronic radiculopathy from C3-4 down to C6-7.  POSTOPERATIVE DIAGNOSIS:  Cervical stenosis with chronic radiculopathy from C3-4 down to C6-7.  PROCEDURE:  Anterior cervical 3-4, 4-5, 5-6, and 6-7 diskectomy, decompression of the spinal cord, bilateral foraminotomy at all 4 level, interbody fusion with cages at all 4 level plate, microscope.  SURGEON:  Hilda Lias, MD  ASSISTANT:  Coletta Memos, M.D.  CLINICAL HISTORY:  The patient was seen in my office complaining of neck pain with radiation to the right upper extremity which had been going on for several months.  In the past, this gentleman had the same problem, but eventually it went away with medication.  At this time around, it is getting worse.  He does feel, he had no relief of the pain.  The MRI showed that he has a stenosis from cervical 3-4, 4-5, 5-6, 6-7 on borderline between 2-3 and 7 thoracic 1.  The patient want to proceed with surgery because he was getting worse.  PROCEDURE:  The patient was taken to the OR, and after intubation, the left side of the neck was cleaned with DuraPrep.  Drapes were applied. Then longitudinal incision through the skin, subcutaneous tissue, and platysma was carried down.  We identified the cervical spine and the first x-ray showed that indeed we were at the level of cervical 4-5.  We brought immediately the microscope into the area and we opened the anterior ligament between C3-C4.  The patient had quite a bit of degenerative disk at this level, decompression of the spinal cord as well as the foramen was achieved,  living good space not only for the core, but both C4 nerve root.  At the level of cervical 4-5, 5-6, 6-7 after we opened the anterior ligament, the situation was worse.  The patient had quite a bit of degenerative disk disease, with almost normal space and we had to do a posterior drilling all the way down to the posterior ligament.  The posterior ligament was also opened at those 3 levels and decompression of the spinal cord as well as the both C5, C6, and C7 nerve root was achieved.  Then, with the hook, we explored the area and we had plenty of room not only for the spinal cord, but also for the foramen.  Then, a cage of 6 mm height, lordotic with autograft and DBX was inserted at the level of C3-4.  At the level of 4-5, 5-6, 6- 7, the 3 cages were 7 mm height with the same autograft and DBX inside. After we applied the cages, this was followed with a fusion using a plate from Z6-X0 using a total of 10 screws.  Lateral cervical spine x-rays showed good position of the cages as well as the plate.  The area was irrigated. Drain was left  in the area and the wound was closed with Vicryl and Steri-Strips.  The patient did well.          ______________________________ Hilda Lias, M.D.     EB/MEDQ  D:  03/31/2013  T:  04/01/2013  Job:  [id number]

## 2013-05-19 ENCOUNTER — Emergency Department: Payer: Self-pay | Admitting: Emergency Medicine

## 2013-05-19 LAB — URINALYSIS, COMPLETE
Bacteria: NONE SEEN
Bilirubin,UR: NEGATIVE
Blood: NEGATIVE
Leukocyte Esterase: NEGATIVE
Nitrite: NEGATIVE
Protein: NEGATIVE
Specific Gravity: 1.023 (ref 1.003–1.030)
Squamous Epithelial: NONE SEEN

## 2013-12-25 ENCOUNTER — Ambulatory Visit: Payer: Self-pay | Admitting: Gastroenterology

## 2015-07-02 DIAGNOSIS — J069 Acute upper respiratory infection, unspecified: Secondary | ICD-10-CM | POA: Diagnosis not present

## 2016-09-08 DIAGNOSIS — J019 Acute sinusitis, unspecified: Secondary | ICD-10-CM | POA: Diagnosis not present

## 2016-09-08 DIAGNOSIS — B9689 Other specified bacterial agents as the cause of diseases classified elsewhere: Secondary | ICD-10-CM | POA: Diagnosis not present

## 2016-10-30 DIAGNOSIS — J019 Acute sinusitis, unspecified: Secondary | ICD-10-CM | POA: Diagnosis not present

## 2016-10-30 DIAGNOSIS — B9689 Other specified bacterial agents as the cause of diseases classified elsewhere: Secondary | ICD-10-CM | POA: Diagnosis not present

## 2016-10-30 DIAGNOSIS — R05 Cough: Secondary | ICD-10-CM | POA: Diagnosis not present

## 2016-12-25 DIAGNOSIS — L821 Other seborrheic keratosis: Secondary | ICD-10-CM | POA: Diagnosis not present

## 2016-12-25 DIAGNOSIS — L570 Actinic keratosis: Secondary | ICD-10-CM | POA: Diagnosis not present

## 2016-12-25 DIAGNOSIS — L820 Inflamed seborrheic keratosis: Secondary | ICD-10-CM | POA: Diagnosis not present

## 2016-12-25 DIAGNOSIS — Z85828 Personal history of other malignant neoplasm of skin: Secondary | ICD-10-CM | POA: Diagnosis not present

## 2016-12-25 DIAGNOSIS — L578 Other skin changes due to chronic exposure to nonionizing radiation: Secondary | ICD-10-CM | POA: Diagnosis not present

## 2017-10-22 DIAGNOSIS — J40 Bronchitis, not specified as acute or chronic: Secondary | ICD-10-CM | POA: Diagnosis not present

## 2017-10-22 DIAGNOSIS — J019 Acute sinusitis, unspecified: Secondary | ICD-10-CM | POA: Diagnosis not present

## 2017-10-22 DIAGNOSIS — B9689 Other specified bacterial agents as the cause of diseases classified elsewhere: Secondary | ICD-10-CM | POA: Diagnosis not present

## 2018-01-04 DIAGNOSIS — J069 Acute upper respiratory infection, unspecified: Secondary | ICD-10-CM | POA: Diagnosis not present

## 2018-01-04 DIAGNOSIS — R05 Cough: Secondary | ICD-10-CM | POA: Diagnosis not present

## 2018-03-17 DIAGNOSIS — M898X1 Other specified disorders of bone, shoulder: Secondary | ICD-10-CM | POA: Diagnosis not present

## 2018-03-17 DIAGNOSIS — S4991XA Unspecified injury of right shoulder and upper arm, initial encounter: Secondary | ICD-10-CM | POA: Diagnosis not present

## 2018-03-17 DIAGNOSIS — M25511 Pain in right shoulder: Secondary | ICD-10-CM | POA: Diagnosis not present

## 2018-04-05 DIAGNOSIS — M898X1 Other specified disorders of bone, shoulder: Secondary | ICD-10-CM | POA: Diagnosis not present

## 2018-04-15 DIAGNOSIS — M25511 Pain in right shoulder: Secondary | ICD-10-CM | POA: Diagnosis not present

## 2018-04-15 DIAGNOSIS — S42101D Fracture of unspecified part of scapula, right shoulder, subsequent encounter for fracture with routine healing: Secondary | ICD-10-CM | POA: Diagnosis not present

## 2018-04-17 DIAGNOSIS — S42101D Fracture of unspecified part of scapula, right shoulder, subsequent encounter for fracture with routine healing: Secondary | ICD-10-CM | POA: Diagnosis not present

## 2018-04-17 DIAGNOSIS — M25511 Pain in right shoulder: Secondary | ICD-10-CM | POA: Diagnosis not present

## 2018-05-06 DIAGNOSIS — M898X1 Other specified disorders of bone, shoulder: Secondary | ICD-10-CM | POA: Diagnosis not present

## 2018-05-06 DIAGNOSIS — M7581 Other shoulder lesions, right shoulder: Secondary | ICD-10-CM | POA: Diagnosis not present

## 2018-05-25 DIAGNOSIS — J069 Acute upper respiratory infection, unspecified: Secondary | ICD-10-CM | POA: Diagnosis not present

## 2018-06-24 ENCOUNTER — Other Ambulatory Visit: Payer: Self-pay | Admitting: Student

## 2018-06-24 ENCOUNTER — Other Ambulatory Visit (HOSPITAL_COMMUNITY): Payer: Self-pay | Admitting: Student

## 2018-06-24 DIAGNOSIS — M898X1 Other specified disorders of bone, shoulder: Secondary | ICD-10-CM

## 2018-06-24 DIAGNOSIS — M7581 Other shoulder lesions, right shoulder: Secondary | ICD-10-CM

## 2018-07-01 ENCOUNTER — Ambulatory Visit
Admission: RE | Admit: 2018-07-01 | Discharge: 2018-07-01 | Disposition: A | Discharge status: Home / Self Care | Payer: PPO | Source: Ambulatory Visit | Attending: Student | Admitting: Student

## 2018-07-01 DIAGNOSIS — M898X1 Other specified disorders of bone, shoulder: Secondary | ICD-10-CM | POA: Diagnosis not present

## 2018-07-01 DIAGNOSIS — M7581 Other shoulder lesions, right shoulder: Secondary | ICD-10-CM | POA: Insufficient documentation

## 2018-07-01 DIAGNOSIS — M75121 Complete rotator cuff tear or rupture of right shoulder, not specified as traumatic: Secondary | ICD-10-CM | POA: Diagnosis not present

## 2018-07-03 DIAGNOSIS — M12811 Other specific arthropathies, not elsewhere classified, right shoulder: Secondary | ICD-10-CM | POA: Diagnosis not present

## 2018-07-03 DIAGNOSIS — M75121 Complete rotator cuff tear or rupture of right shoulder, not specified as traumatic: Secondary | ICD-10-CM | POA: Diagnosis not present

## 2018-07-22 DIAGNOSIS — M75121 Complete rotator cuff tear or rupture of right shoulder, not specified as traumatic: Secondary | ICD-10-CM | POA: Diagnosis not present

## 2018-07-22 DIAGNOSIS — M12811 Other specific arthropathies, not elsewhere classified, right shoulder: Secondary | ICD-10-CM | POA: Diagnosis not present

## 2018-08-13 ENCOUNTER — Other Ambulatory Visit: Payer: Self-pay

## 2018-08-13 ENCOUNTER — Encounter
Admission: RE | Admit: 2018-08-13 | Discharge: 2018-08-13 | Disposition: A | Discharge status: Home / Self Care | Payer: PPO | Source: Ambulatory Visit | Attending: Surgery | Admitting: Surgery

## 2018-08-13 DIAGNOSIS — Z01812 Encounter for preprocedural laboratory examination: Secondary | ICD-10-CM | POA: Diagnosis not present

## 2018-08-13 DIAGNOSIS — Z01810 Encounter for preprocedural cardiovascular examination: Secondary | ICD-10-CM | POA: Diagnosis not present

## 2018-08-13 LAB — URINALYSIS, ROUTINE W REFLEX MICROSCOPIC
Bacteria, UA: NONE SEEN
Bilirubin Urine: NEGATIVE
Glucose, UA: NEGATIVE mg/dL
Ketones, ur: NEGATIVE mg/dL
Leukocytes,Ua: NEGATIVE
Nitrite: NEGATIVE
Protein, ur: NEGATIVE mg/dL
Specific Gravity, Urine: 1.023 (ref 1.005–1.030)
Squamous Epithelial / HPF: NONE SEEN (ref 0–5)
pH: 5 (ref 5.0–8.0)

## 2018-08-13 LAB — TYPE AND SCREEN
ABO/RH(D): A POS
ANTIBODY SCREEN: NEGATIVE

## 2018-08-13 LAB — CBC WITH DIFFERENTIAL/PLATELET
ABS IMMATURE GRANULOCYTES: 0.02 10*3/uL (ref 0.00–0.07)
Basophils Absolute: 0.1 10*3/uL (ref 0.0–0.1)
Basophils Relative: 1 %
Eosinophils Absolute: 0.1 10*3/uL (ref 0.0–0.5)
Eosinophils Relative: 2 %
HCT: 44.4 % (ref 39.0–52.0)
Hemoglobin: 14.6 g/dL (ref 13.0–17.0)
Immature Granulocytes: 0 %
Lymphocytes Relative: 29 %
Lymphs Abs: 1.6 10*3/uL (ref 0.7–4.0)
MCH: 29.6 pg (ref 26.0–34.0)
MCHC: 32.9 g/dL (ref 30.0–36.0)
MCV: 90.1 fL (ref 80.0–100.0)
MONO ABS: 0.6 10*3/uL (ref 0.1–1.0)
Monocytes Relative: 10 %
NEUTROS ABS: 3.2 10*3/uL (ref 1.7–7.7)
Neutrophils Relative %: 58 %
PLATELETS: 247 10*3/uL (ref 150–400)
RBC: 4.93 MIL/uL (ref 4.22–5.81)
RDW: 12.9 % (ref 11.5–15.5)
WBC: 5.5 10*3/uL (ref 4.0–10.5)
nRBC: 0 % (ref 0.0–0.2)

## 2018-08-13 LAB — BASIC METABOLIC PANEL
Anion gap: 8 (ref 5–15)
BUN: 22 mg/dL (ref 8–23)
CO2: 24 mmol/L (ref 22–32)
CREATININE: 0.85 mg/dL (ref 0.61–1.24)
Calcium: 9 mg/dL (ref 8.9–10.3)
Chloride: 107 mmol/L (ref 98–111)
GFR calc Af Amer: >60 mL/min (ref >60)
GFR calc non Af Amer: >60 mL/min (ref >60)
Glucose, Bld: 98 mg/dL (ref 70–99)
Potassium: 3.8 mmol/L (ref 3.5–5.1)
SODIUM: 139 mmol/L (ref 135–145)

## 2018-08-13 LAB — SURGICAL PCR SCREEN
MRSA, PCR: NEGATIVE
Staphylococcus aureus: NEGATIVE

## 2018-08-13 NOTE — Pre-Procedure Instructions (Signed)
UA FAXED TO DR Roland Rack

## 2018-08-13 NOTE — Patient Instructions (Signed)
INSTRUCTIONS FOR SURGERY     Your surgery is scheduled for: Tuesday, August 20, 2018     To find out your arrival time for the day of surgery,          please call [phone] between 1 pm and 3 pm on : Monday, August 19, 2018     When you arrive for surgery, report to the [facility].       Do NOT stop on the first floor to register.    REMEMBER: Instructions that are not followed completely may result in serious medical risk,  up to and including death, or upon the discretion of your surgeon and anesthesiologist,            your surgery may need to be rescheduled.  __X__ 1. Do not eat food after midnight the night before your procedure.                    No gum, candy, lozenger, tic tacs, tums or hard candies.                  ABSOLUTELY NOTHING SOLID IN YOUR MOUTH AFTER MIDNIGHT                    You may drink unlimited clear liquids up to 2 hours before you are scheduled to arrive for surgery.                   Do not drink anything within those 2 hours unless you need to take medicine, then take the                   smallest amount you need.  Clear liquids include:  water, apple juice without pulp,                   any flavor Gatorade, Black coffee, black tea.  Sugar may be added but no dairy/ honey /lemon.                        Broth and jello is not considered a clear liquid.  __x__  2. On the morning of surgery, please brush your teeth with toothpaste and water. You may rinse with                  mouthwash if you wish but DO NOT SWALLOW TOOTHPASTE OR MOUTHWASH  __X___3. NO alcohol for 24 hours before or after surgery.  __x___ 4.  Do NOT smoke or use e-cigarettes for 24 HOURS PRIOR TO SURGERY.                      DO NOT Use any chewable tobacco products for at least 6 hours prior to surgery.  __x___ 5. If you start any new medication after this appointment and prior to surgery, please                    Bring it with you on the day of surgery.  ___x__ 6. Notify your doctor if there is any change in your  medical condition, such as fever, infection, vomitting, diarrhea.  __x___ 7.  USE the CHG SOAP as instructed, the night before surgery and the day of surgery.                   Once you have washed with this soap, do NOT use any of the following: Powders, perfumes or lotions.                   Please do not wear make up, hairpins, clips or nail polish. You MAY NOT wear deodorant.                   Men may shave their face and neck.  Women need to shave 48 hours prior to surgery.                   DO NOT wear ANY jewelry on the day of surgery. If there are rings that are too tight to remove easily,                     please address this prior to the surgery day. Piercings need to be removed.                                                                     NO METAL ON YOUR BODY.                    Do NOT bring any valuables.  If you came to Pre-Admit testing then you will not need license,                     insurance card or credit card.  If you will be staying overnight, please either leave your things in the car                      or have your family be responsible for these items.                     [facility] IS NOT RESPONSIBLE FOR BELONGINGS OR VALUABLES.  ___X__ 8. DO NOT wear contact lenses on surgery day.  You may not have dentures,                     Hearing aides, contacts or glasses in the operating room. These items can be                    Placed in the Recovery Room to receive immediately after surgery.  __x___ 9. IF YOU ARE SCHEDULED TO GO HOME ON THE SAME DAY, YOU MUST                   Have someone to drive you home and to stay with you  for the first 24 hours.                    Have an arrangement prior to arriving on surgery day.  ___x__ 10. Take the following medications on the morning of surgery with a sip of water:  1.  NONE                     2.                     3.                     4.                _____ 11.  Follow any instructions provided to you by your surgeon.                        Such as enema, clear liquid bowel prep  __X__  12. STOP  ASPIRIN AS OF: TODAY                       THIS INCLUDES BC POWDERS / GOODIES POWDER  __x___ 13. STOP Anti-inflammatories as of: TODAY                      This includes IBUPROFEN / MOTRIN / ADVIL / ALEVE/ NAPROXYN                    YOU MAY TAKE TYLENOL ANY TIME PRIOR TO SURGERY.  _____ 14.  Stop supplements until after surgery.                     This includes:N/A                 You may continue taking Vitamin B12 / Vitamin D3 but do not take on the morning of surgery.  _____ 15. Bring your CPAP machine into preop with you on the morning of surgery.  ______16.  Stop Metformin 2 full days prior to surgery.  Stop on:                     TAKE 1/2 OF USUAL INSULIN DOSE ON THE EVENING PRIOR TO SURGERY.                     Do NOT take any diabetes medications on surgery day.  ______17.  Continue to take the following medications but do not take on the morning of surgery: N/A  ______18. If staying overnight, please have appropriate shoes to wear to be able to walk around the unit.                   Wear clean and comfortable clothing to the hospital. BRING OR WEAR A LARGE STRETCHY SHIRT OR A BUTTON DOWN SHIRT

## 2018-08-14 LAB — URINE CULTURE: Culture: NO GROWTH

## 2018-08-19 MED ORDER — CEFAZOLIN SODIUM-DEXTROSE 2-4 GM/100ML-% IV SOLN
2.0000 g | Freq: Once | INTRAVENOUS | Status: AC
  Administered 2018-08-20: 2 g via INTRAVENOUS

## 2018-08-20 ENCOUNTER — Other Ambulatory Visit: Payer: Self-pay

## 2018-08-20 ENCOUNTER — Inpatient Hospital Stay: Payer: PPO

## 2018-08-20 ENCOUNTER — Encounter
Admission: RE | Disposition: A | Discharge status: Home Health | Payer: Self-pay | Source: Home / Self Care | Attending: Surgery

## 2018-08-20 ENCOUNTER — Inpatient Hospital Stay
Admission: RE | Admit: 2018-08-20 | Discharge: 2018-08-21 | DRG: 483 | Disposition: A | Discharge status: Home Health | Payer: PPO | Attending: Surgery | Admitting: Surgery

## 2018-08-20 ENCOUNTER — Encounter: Payer: Self-pay | Admitting: *Deleted

## 2018-08-20 ENCOUNTER — Inpatient Hospital Stay: Payer: PPO | Admitting: Anesthesiology

## 2018-08-20 DIAGNOSIS — Z888 Allergy status to other drugs, medicaments and biological substances status: Secondary | ICD-10-CM | POA: Diagnosis not present

## 2018-08-20 DIAGNOSIS — M25511 Pain in right shoulder: Secondary | ICD-10-CM | POA: Diagnosis not present

## 2018-08-20 DIAGNOSIS — Z86010 Personal history of colonic polyps: Secondary | ICD-10-CM

## 2018-08-20 DIAGNOSIS — H9190 Unspecified hearing loss, unspecified ear: Secondary | ICD-10-CM | POA: Diagnosis present

## 2018-08-20 DIAGNOSIS — Z471 Aftercare following joint replacement surgery: Secondary | ICD-10-CM | POA: Diagnosis not present

## 2018-08-20 DIAGNOSIS — M75121 Complete rotator cuff tear or rupture of right shoulder, not specified as traumatic: Secondary | ICD-10-CM | POA: Diagnosis not present

## 2018-08-20 DIAGNOSIS — Z96611 Presence of right artificial shoulder joint: Secondary | ICD-10-CM

## 2018-08-20 DIAGNOSIS — Z7982 Long term (current) use of aspirin: Secondary | ICD-10-CM

## 2018-08-20 DIAGNOSIS — G8918 Other acute postprocedural pain: Secondary | ICD-10-CM | POA: Diagnosis not present

## 2018-08-20 DIAGNOSIS — M12811 Other specific arthropathies, not elsewhere classified, right shoulder: Secondary | ICD-10-CM | POA: Diagnosis not present

## 2018-08-20 DIAGNOSIS — Z8546 Personal history of malignant neoplasm of prostate: Secondary | ICD-10-CM | POA: Diagnosis not present

## 2018-08-20 DIAGNOSIS — M19011 Primary osteoarthritis, right shoulder: Secondary | ICD-10-CM | POA: Diagnosis not present

## 2018-08-20 HISTORY — DX: Presence of right artificial shoulder joint: Z96.611

## 2018-08-20 HISTORY — PX: REVERSE SHOULDER ARTHROPLASTY: SHX5054

## 2018-08-20 LAB — ABO/RH: ABO/RH(D): A POS

## 2018-08-20 SURGERY — ARTHROPLASTY, SHOULDER, TOTAL, REVERSE
Anesthesia: General | Site: Shoulder | Laterality: Right

## 2018-08-20 MED ORDER — METOCLOPRAMIDE HCL 5 MG/ML IJ SOLN: 5.0000 mg | Freq: Three times a day (TID) | INTRAMUSCULAR | Status: DC | PRN

## 2018-08-20 MED ORDER — ROCURONIUM BROMIDE 50 MG/5ML IV SOLN
INTRAVENOUS | Status: AC
  Filled 2018-08-20: qty 2

## 2018-08-20 MED ORDER — METOCLOPRAMIDE HCL 10 MG PO TABS: 5.0000 mg | ORAL_TABLET | Freq: Three times a day (TID) | ORAL | Status: DC | PRN

## 2018-08-20 MED ORDER — ACETAMINOPHEN 325 MG PO TABS: 325.0000 mg | ORAL_TABLET | Freq: Four times a day (QID) | ORAL | Status: DC | PRN

## 2018-08-20 MED ORDER — PROPOFOL 10 MG/ML IV BOLUS
INTRAVENOUS | Status: DC | PRN
  Administered 2018-08-20 (×2): 20 mg via INTRAVENOUS
  Administered 2018-08-20: 110 mg via INTRAVENOUS

## 2018-08-20 MED ORDER — ONDANSETRON HCL 4 MG/2ML IJ SOLN: 4.0000 mg | Freq: Four times a day (QID) | INTRAMUSCULAR | Status: DC | PRN

## 2018-08-20 MED ORDER — SUGAMMADEX SODIUM 200 MG/2ML IV SOLN
INTRAVENOUS | Status: DC | PRN
  Administered 2018-08-20: 200 mg via INTRAVENOUS

## 2018-08-20 MED ORDER — BISACODYL 10 MG RE SUPP: 10.0000 mg | Freq: Every day | RECTAL | Status: DC | PRN

## 2018-08-20 MED ORDER — TRAMADOL HCL 50 MG PO TABS: 50.0000 mg | ORAL_TABLET | Freq: Four times a day (QID) | ORAL | Status: DC | PRN

## 2018-08-20 MED ORDER — FENTANYL CITRATE (PF) 100 MCG/2ML IJ SOLN
INTRAMUSCULAR | Status: AC
  Administered 2018-08-20: 25 ug via INTRAVENOUS
  Filled 2018-08-20: qty 2

## 2018-08-20 MED ORDER — LIDOCAINE HCL (PF) 1 % IJ SOLN
INTRAMUSCULAR | Status: AC
  Filled 2018-08-20: qty 5

## 2018-08-20 MED ORDER — LACTATED RINGERS IV SOLN
INTRAVENOUS | Status: DC
  Administered 2018-08-20 (×2): via INTRAVENOUS

## 2018-08-20 MED ORDER — EPHEDRINE SULFATE 50 MG/ML IJ SOLN
INTRAMUSCULAR | Status: AC
  Filled 2018-08-20: qty 1

## 2018-08-20 MED ORDER — SODIUM CHLORIDE 0.9 % IV SOLN
INTRAVENOUS | Status: DC
  Administered 2018-08-20: 14:00:00 via INTRAVENOUS

## 2018-08-20 MED ORDER — LIDOCAINE HCL (PF) 2 % IJ SOLN
INTRAMUSCULAR | Status: AC
  Filled 2018-08-20: qty 10

## 2018-08-20 MED ORDER — BUPIVACAINE-EPINEPHRINE (PF) 0.5% -1:200000 IJ SOLN
INTRAMUSCULAR | Status: AC
  Filled 2018-08-20: qty 30

## 2018-08-20 MED ORDER — TRANEXAMIC ACID 1000 MG/10ML IV SOLN
INTRAVENOUS | Status: DC | PRN
  Administered 2018-08-20: 1000 mg

## 2018-08-20 MED ORDER — DEXAMETHASONE SODIUM PHOSPHATE 4 MG/ML IJ SOLN
INTRAMUSCULAR | Status: DC | PRN
  Administered 2018-08-20: 5 mg via INTRAVENOUS

## 2018-08-20 MED ORDER — EPHEDRINE SULFATE 50 MG/ML IJ SOLN
INTRAMUSCULAR | Status: DC | PRN
  Administered 2018-08-20: 10 mg via INTRAVENOUS

## 2018-08-20 MED ORDER — MIDAZOLAM HCL 2 MG/2ML IJ SOLN
INTRAMUSCULAR | Status: AC
  Administered 2018-08-20: 1 mg via INTRAVENOUS
  Filled 2018-08-20: qty 2

## 2018-08-20 MED ORDER — DOCUSATE SODIUM 100 MG PO CAPS
100.0000 mg | ORAL_CAPSULE | Freq: Two times a day (BID) | ORAL | Status: DC
  Administered 2018-08-21 (×2): 100 mg via ORAL
  Filled 2018-08-20 (×2): qty 1

## 2018-08-20 MED ORDER — FENTANYL CITRATE (PF) 100 MCG/2ML IJ SOLN
INTRAMUSCULAR | Status: DC | PRN
  Administered 2018-08-20: 50 ug via INTRAVENOUS
  Administered 2018-08-20 (×2): 25 ug via INTRAVENOUS

## 2018-08-20 MED ORDER — ROCURONIUM BROMIDE 100 MG/10ML IV SOLN
INTRAVENOUS | Status: DC | PRN
  Administered 2018-08-20: 20 mg via INTRAVENOUS
  Administered 2018-08-20: 70 mg via INTRAVENOUS

## 2018-08-20 MED ORDER — GLYCOPYRROLATE 0.2 MG/ML IJ SOLN
INTRAMUSCULAR | Status: DC | PRN
  Administered 2018-08-20: 0.2 mg via INTRAVENOUS

## 2018-08-20 MED ORDER — LIDOCAINE HCL (CARDIAC) PF 100 MG/5ML IV SOSY
PREFILLED_SYRINGE | INTRAVENOUS | Status: DC | PRN
  Administered 2018-08-20: 100 mg via INTRAVENOUS

## 2018-08-20 MED ORDER — MIDAZOLAM HCL 2 MG/2ML IJ SOLN
1.0000 mg | Freq: Once | INTRAMUSCULAR | Status: AC
  Administered 2018-08-20: 1 mg via INTRAVENOUS

## 2018-08-20 MED ORDER — ENOXAPARIN SODIUM 40 MG/0.4ML ~~LOC~~ SOLN
40.0000 mg | SUBCUTANEOUS | Status: DC
  Administered 2018-08-21: 40 mg via SUBCUTANEOUS
  Filled 2018-08-20: qty 0.4

## 2018-08-20 MED ORDER — BUPIVACAINE-EPINEPHRINE (PF) 0.5% -1:200000 IJ SOLN
INTRAMUSCULAR | Status: DC | PRN
  Administered 2018-08-20: 30 mL

## 2018-08-20 MED ORDER — DIPHENHYDRAMINE HCL 12.5 MG/5ML PO ELIX: 12.5000 mg | ORAL_SOLUTION | ORAL | Status: DC | PRN

## 2018-08-20 MED ORDER — LIDOCAINE HCL (PF) 1.5 % IJ SOLN
INTRAMUSCULAR | Status: DC | PRN
  Administered 2018-08-20: 2 mL via INTRADERMAL

## 2018-08-20 MED ORDER — FAMOTIDINE 20 MG PO TABS
20.0000 mg | ORAL_TABLET | Freq: Once | ORAL | Status: AC
  Administered 2018-08-20: 20 mg via ORAL

## 2018-08-20 MED ORDER — ONDANSETRON HCL 4 MG/2ML IJ SOLN: 4.0000 mg | Freq: Once | INTRAMUSCULAR | Status: DC | PRN

## 2018-08-20 MED ORDER — SODIUM CHLORIDE 0.9 % IV SOLN
INTRAVENOUS | Status: DC | PRN
  Administered 2018-08-20: 30 ug/min via INTRAVENOUS

## 2018-08-20 MED ORDER — CEFAZOLIN SODIUM-DEXTROSE 2-4 GM/100ML-% IV SOLN
2.0000 g | Freq: Four times a day (QID) | INTRAVENOUS | Status: AC
  Administered 2018-08-20 – 2018-08-21 (×3): 2 g via INTRAVENOUS
  Filled 2018-08-20 (×3): qty 100

## 2018-08-20 MED ORDER — ASPIRIN EC 81 MG PO TBEC: 81.0000 mg | DELAYED_RELEASE_TABLET | Freq: Four times a day (QID) | ORAL | Status: DC | PRN

## 2018-08-20 MED ORDER — FENTANYL CITRATE (PF) 250 MCG/5ML IJ SOLN
INTRAMUSCULAR | Status: AC
  Filled 2018-08-20: qty 5

## 2018-08-20 MED ORDER — OXYCODONE HCL 5 MG PO TABS: 5.0000 mg | ORAL_TABLET | ORAL | Status: DC | PRN

## 2018-08-20 MED ORDER — BUPIVACAINE HCL (PF) 0.5 % IJ SOLN
INTRAMUSCULAR | Status: DC | PRN
  Administered 2018-08-20: 10 mL

## 2018-08-20 MED ORDER — CEFAZOLIN SODIUM-DEXTROSE 2-4 GM/100ML-% IV SOLN
INTRAVENOUS | Status: AC
  Filled 2018-08-20: qty 100

## 2018-08-20 MED ORDER — ONDANSETRON HCL 4 MG PO TABS: 4.0000 mg | ORAL_TABLET | Freq: Four times a day (QID) | ORAL | Status: DC | PRN

## 2018-08-20 MED ORDER — HYDROMORPHONE HCL 1 MG/ML IJ SOLN: 0.2500 mg | INTRAMUSCULAR | Status: DC | PRN

## 2018-08-20 MED ORDER — PROPOFOL 10 MG/ML IV BOLUS
INTRAVENOUS | Status: AC
  Filled 2018-08-20: qty 20

## 2018-08-20 MED ORDER — MAGNESIUM HYDROXIDE 400 MG/5ML PO SUSP: 30.0000 mL | Freq: Every day | ORAL | Status: DC | PRN

## 2018-08-20 MED ORDER — FLEET ENEMA 7-19 GM/118ML RE ENEM: 1.0000 | ENEMA | Freq: Once | RECTAL | Status: DC | PRN

## 2018-08-20 MED ORDER — FENTANYL CITRATE (PF) 100 MCG/2ML IJ SOLN
25.0000 ug | Freq: Once | INTRAMUSCULAR | Status: AC
  Administered 2018-08-20: 25 ug via INTRAVENOUS

## 2018-08-20 MED ORDER — FENTANYL CITRATE (PF) 100 MCG/2ML IJ SOLN: 25.0000 ug | INTRAMUSCULAR | Status: DC | PRN

## 2018-08-20 MED ORDER — FAMOTIDINE 20 MG PO TABS
ORAL_TABLET | ORAL | Status: AC
  Filled 2018-08-20: qty 1

## 2018-08-20 MED ORDER — BUPIVACAINE HCL (PF) 0.5 % IJ SOLN
INTRAMUSCULAR | Status: AC
  Filled 2018-08-20: qty 10

## 2018-08-20 MED ORDER — BUPIVACAINE LIPOSOME 1.3 % IJ SUSP
INTRAMUSCULAR | Status: AC
  Filled 2018-08-20: qty 20

## 2018-08-20 MED ORDER — ACETAMINOPHEN 500 MG PO TABS
1000.0000 mg | ORAL_TABLET | Freq: Four times a day (QID) | ORAL | Status: DC
  Administered 2018-08-20 – 2018-08-21 (×3): 1000 mg via ORAL
  Filled 2018-08-20 (×3): qty 2

## 2018-08-20 MED ORDER — BUPIVACAINE LIPOSOME 1.3 % IJ SUSP
INTRAMUSCULAR | Status: DC | PRN
  Administered 2018-08-20: 20 mL via PERINEURAL

## 2018-08-20 MED ORDER — TRANEXAMIC ACID 1000 MG/10ML IV SOLN
INTRAVENOUS | Status: AC
  Filled 2018-08-20: qty 10

## 2018-08-20 SURGICAL SUPPLY — 67 items
BASEPLATE GLENOSPHERE 25 (Plate) ×2 IMPLANT
BASEPLATE GLENOSPHERE 25MM (Plate) ×1 IMPLANT
BEARING HUMERAL 40 STD VITE (Joint) ×3 IMPLANT
BIT DRILL TWIST 2.7 (BIT) ×4 IMPLANT
BIT DRILL TWIST 2.7MM (BIT) ×2
BLADE SAGITTAL WIDE XTHICK NO (BLADE) ×3 IMPLANT
CANISTER SUCT 1200ML W/VALVE (MISCELLANEOUS) ×3 IMPLANT
CANISTER SUCT 3000ML PPV (MISCELLANEOUS) ×6 IMPLANT
CHLORAPREP W/TINT 26 (MISCELLANEOUS) ×3 IMPLANT
COOLER POLAR GLACIER W/PUMP (MISCELLANEOUS) ×3 IMPLANT
COVER WAND RF STERILE (DRAPES) ×3 IMPLANT
CRADLE LAMINECT ARM (MISCELLANEOUS) ×3 IMPLANT
DIAL VERSA SHOULDER 40 STD (Joint) ×3 IMPLANT
DRAPE IMP U-DRAPE 54X76 (DRAPES) ×6 IMPLANT
DRAPE INCISE IOBAN 66X45 STRL (DRAPES) ×6 IMPLANT
DRAPE SHEET LG 3/4 BI-LAMINATE (DRAPES) ×6 IMPLANT
DRAPE TABLE BACK 80X90 (DRAPES) ×3 IMPLANT
DRSG OPSITE POSTOP 4X8 (GAUZE/BANDAGES/DRESSINGS) ×3 IMPLANT
ELECT BLADE 6.5 EXT (BLADE) IMPLANT
ELECT CAUTERY BLADE 6.4 (BLADE) ×3 IMPLANT
GLOVE BIO SURGEON STRL SZ7.5 (GLOVE) ×12 IMPLANT
GLOVE BIO SURGEON STRL SZ8 (GLOVE) ×12 IMPLANT
GLOVE BIOGEL PI IND STRL 8 (GLOVE) ×1 IMPLANT
GLOVE BIOGEL PI INDICATOR 8 (GLOVE) ×2
GLOVE INDICATOR 8.0 STRL GRN (GLOVE) ×3 IMPLANT
GOWN STRL REUS W/ TWL LRG LVL3 (GOWN DISPOSABLE) ×1 IMPLANT
GOWN STRL REUS W/ TWL XL LVL3 (GOWN DISPOSABLE) ×1 IMPLANT
GOWN STRL REUS W/TWL LRG LVL3 (GOWN DISPOSABLE) ×2
GOWN STRL REUS W/TWL XL LVL3 (GOWN DISPOSABLE) ×2
HOOD PEEL AWAY FLYTE STAYCOOL (MISCELLANEOUS) ×9 IMPLANT
KIT STABILIZATION SHOULDER (MISCELLANEOUS) ×3 IMPLANT
KIT TURNOVER KIT A (KITS) ×3 IMPLANT
MASK FACE SPIDER DISP (MASK) ×3 IMPLANT
MAT ABSORB  FLUID 56X50 GRAY (MISCELLANEOUS) ×2
MAT ABSORB FLUID 56X50 GRAY (MISCELLANEOUS) ×1 IMPLANT
NDL SAFETY ECLIPSE 18X1.5 (NEEDLE) ×1 IMPLANT
NEEDLE HYPO 18GX1.5 SHARP (NEEDLE) ×2
NEEDLE HYPO 22GX1.5 SAFETY (NEEDLE) ×3 IMPLANT
NEEDLE SPNL 20GX3.5 QUINCKE YW (NEEDLE) ×3 IMPLANT
NS IRRIG 500ML POUR BTL (IV SOLUTION) ×3 IMPLANT
PACK ARTHROSCOPY SHOULDER (MISCELLANEOUS) ×3 IMPLANT
PAD WRAPON POLAR SHDR UNIV (MISCELLANEOUS) ×1 IMPLANT
PIN THREADED REVERSE (PIN) ×3 IMPLANT
PULSAVAC PLUS IRRIG FAN TIP (DISPOSABLE) ×3
SCREW BONE LOCKING 4.75X35X3.5 (Screw) ×3 IMPLANT
SCREW BONE STRL 6.5MMX45MM (Screw) ×3 IMPLANT
SCREW NON-LOCK 4.75X20X3.5 (Screw) ×3 IMPLANT
SCREW NON-LOCK 4.75X35X3.5 (Screw) ×6 IMPLANT
SLING ULTRA II M (MISCELLANEOUS) ×3 IMPLANT
SOL .9 NS 3000ML IRR  AL (IV SOLUTION) ×2
SOL .9 NS 3000ML IRR UROMATIC (IV SOLUTION) ×1 IMPLANT
SPONGE LAP 18X18 RF (DISPOSABLE) ×3 IMPLANT
STAPLER SKIN PROX 35W (STAPLE) ×3 IMPLANT
STEM HUMERAL STRL 14MMX140MM (Stem) ×3 IMPLANT
SUT ETHIBOND 0 MO6 C/R (SUTURE) ×3 IMPLANT
SUT FIBERWIRE #2 38 BLUE 1/2 (SUTURE) ×12
SUT MAXBRAID #2 CVD NDL (SUTURE) ×6 IMPLANT
SUT VIC AB 0 CT1 36 (SUTURE) ×3 IMPLANT
SUT VIC AB 2-0 CT1 27 (SUTURE) ×4
SUT VIC AB 2-0 CT1 TAPERPNT 27 (SUTURE) ×2 IMPLANT
SUTURE FIBERWR #2 38 BLUE 1/2 (SUTURE) ×4 IMPLANT
SYR 10ML LL (SYRINGE) ×3 IMPLANT
SYR 30ML LL (SYRINGE) IMPLANT
TIP FAN IRRIG PULSAVAC PLUS (DISPOSABLE) ×1 IMPLANT
TRAY FOLEY MTR SLVR 16FR STAT (SET/KITS/TRAYS/PACK) IMPLANT
TRAY HUM MINI SHOULDER +0 40D (Shoulder) ×3 IMPLANT
WRAPON POLAR PAD SHDR UNIV (MISCELLANEOUS) ×3

## 2018-08-20 NOTE — Anesthesia Post-op Follow-up Note (Signed)
Anesthesia QCDR form completed.        

## 2018-08-20 NOTE — Anesthesia Procedure Notes (Signed)
Anesthesia Regional Block: Interscalene brachial plexus block   Pre-Anesthetic Checklist: ,, timeout performed, Correct Patient, Correct Site, Correct Laterality, Correct Procedure, Correct Position, site marked, Risks and benefits discussed,  Surgical consent,  Pre-op evaluation,  At surgeon's request and post-op pain management  Laterality: Right  Prep: chloraprep       Needles:  Injection technique: Single-shot  Needle Type: Echogenic Stimulator Needle     Needle Length: 10cm  Needle Gauge: 20     Additional Needles:   Procedures:, nerve stimulator,,, ultrasound used (permanent image in chart),,,,   Nerve Stimulator or Paresthesia:  Response: biceps flexion,   Additional Responses:   Narrative:  End time: 08/20/2018 9:43 AM Injection made incrementally with aspirations every 5 mL.  Performed by: Personally  Anesthesiologist: Molli Barrows, MD  Additional Notes: Functioning IV was confirmed and monitors were applied.Sterile prep and drape,hand hygiene and sterile gloves were used.  Negative aspiration and negative test dose prior to incremental administration of local anesthetic. No pain with injection and good visualization with [place].. The patient tolerated the procedure well.

## 2018-08-20 NOTE — Clinical Social Work Note (Signed)
CSW received referral for SNF.  Case discussed with case manager and plan is to discharge home with home health.  CSW to sign off please re-consult if social work needs arise.  Kelee Cunningham R. Kaylee Trivett, MSW, LCSW [phone]  

## 2018-08-20 NOTE — Progress Notes (Signed)
OT Cancellation Note  Patient Details Name: Michael Stone. MRN: [id number] DOB: Sep 16, 1935   Cancelled Treatment:    Reason Eval/Treat Not Completed: Other (comment). Consult received, chart reviewed. Pt with PT for assessment upon initial attempt. Will re-attempt OT evaluation at later date/time as pt is available and medically appropriate.   Jeni Salles, MPH, MS, OTR/L ascom [phone] 08/20/18, 4:13 PM

## 2018-08-20 NOTE — Transfer of Care (Signed)
Immediate Anesthesia Transfer of Care Note  Patient: Michael Stone.  Procedure(s) Performed: REVERSE RIGHT TOTAL SHOULDER ARTHROPLASTY (Right Shoulder)  Patient Location: PACU  Anesthesia Type:General  Level of Consciousness: drowsy  Airway & Oxygen Therapy: Patient Spontanous Breathing and Patient connected to nasal cannula oxygen  Post-op Assessment: Report given to RN and Post -op Vital signs reviewed and stable  Post vital signs: Reviewed and stable  Last Vitals:  Vitals Value Taken Time  BP 133/66 08/20/2018  1:18 PM  Temp    Pulse 76 08/20/2018  1:19 PM  Resp 9 08/20/2018  1:19 PM  SpO2 96 % 08/20/2018  1:19 PM  Vitals shown include unvalidated device data.  Last Pain:  Vitals:   08/20/18 0944  TempSrc:   PainSc: 0-No pain         Complications: No apparent anesthesia complications

## 2018-08-20 NOTE — Care Management (Signed)
Sent request to CMA bucket to check price of Lovenox

## 2018-08-20 NOTE — TOC Initial Note (Signed)
Transition of Care (TOC) - Initial/Assessment Note  Met with patient and spouse to discuss DC plan and needs [facility] list provided to the patient per CMS.gov Patient chose [facility] for HH PT, Notified Corene Cornea with [facility] of the referral via secure email   lives in a single family home with wife and still works full time Patient still drives Pharmacy is [facility] PCP Dr. Mikle Bosworth  Patient has no DME needs   Patient Details  Name: Michael Stone. MRN: [id number] Date of Birth: December 15, 1935  Transition of Care [facility]) CM/SW Contact:    Su Hilt, RN Phone Number: 08/20/2018, 3:29 PM  Clinical Narrative:                   Expected Discharge Plan: [place] Barriers to Discharge: Continued Medical Work up   Patient Goals and CMS Choice Patient states their goals for this hospitalization and ongoing recovery are:: go home with PT CMS Medicare.gov Compare Post Acute Care list provided to:: Patient Choice offered to / list presented to : Patient  Expected Discharge Plan and Services Expected Discharge Plan: [place] Discharge Planning Services: CM Consult Post Acute Care Choice: [place] arrangements for the past 2 months: Single Family Home                     HH Arranged: PT, OT [place] Agency: [place] ([facility])  Prior Living Arrangements/Services Living arrangements for the past 2 months: Single Family Home Lives with:: Spouse Patient language and need for interpreter reviewed:: Yes Do you feel safe going back to the place where you live?: Yes      Need for Family Participation in Patient Care: No (Comment) Care giver support system in place?: Yes (comment)   Criminal Activity/Legal Involvement Pertinent to Current Situation/Hospitalization: No - Comment as needed  Activities of Daily Living Home Assistive Devices/Equipment: Eyeglasses, Hearing aid ADL Screening (condition at time of admission) Patient's cognitive  ability adequate to safely complete daily activities?: Yes Is the patient deaf or have difficulty hearing?: Yes Does the patient have difficulty seeing, even when wearing glasses/contacts?: No Does the patient have difficulty concentrating, remembering, or making decisions?: No Patient able to express need for assistance with ADLs?: Yes Does the patient have difficulty dressing or bathing?: No Independently performs ADLs?: Yes (appropriate for developmental age) Does the patient have difficulty walking or climbing stairs?: No Weakness of Legs: None Weakness of Arms/Hands: Right  Permission Sought/Granted Permission sought to share information with : Case Manager Permission granted to share information with : Yes, Verbal Permission Granted           Permission granted to share info w Contact Information: [facility]  Emotional Assessment Appearance:: Appears stated age Attitude/Demeanor/Rapport: Engaged Affect (typically observed): Accepting Orientation: : Oriented to Self, Oriented to Place, Oriented to  Time, Oriented to Situation Alcohol / Substance Use: Never Used Psych Involvement: No (comment)  Admission diagnosis:  [place] OF RIGHT ROTATOR CUFF Patient Active Problem List   Diagnosis Date Noted  . Status post reverse total shoulder replacement, right 08/20/2018   PCP:  Lattie Corns, PA-C Pharmacy:   [facility] [place], [address] [place] [address] Phone: [phone] Fax: [phone]     Social Determinants of Health (SDOH) Interventions    Readmission Risk Interventions 30  Day Unplanned Readmission Risk Score     Admission (Current) from 08/20/2018 in [place] (1A)  30 Day Unplanned Readmission Risk Score (%)  4 Filed at 08/20/2018 1200     This score is the patient's risk of an unplanned readmission within 30  days of being discharged (0 -100%). The score is based on dignosis, age, lab data, medications, orders, and past utilization.   Low:  0-14.9   Medium: 15-21.9   High: 22-29.9   Extreme: 30 and above       No flowsheet data found.

## 2018-08-20 NOTE — TOC Benefit Eligibility Note (Signed)
Transition of Care [facility]) Benefit Eligibility Note    Patient Details  Name: Michael Stone. MRN: [id number] Date of Birth: [birth year]   Medication/Dose: Lovenox 40mg  once daily x 14 days  Covered?: No     Prescription Coverage Preferred Pharmacy: Any retail pharmacy  Spoke with Person/Company/Phone Number:: [employer]/[phone]     Prior Approval: [phone] if Lovenox required.)  Deductible: (No deductible on plan.)  Additional Notes: Generic Enoxaparin is covered.  $90 estimated cost, may be less as rep only able to check for 30 day supply.  Prior approval not required for generic.  Considered Tier 4.     Michael Stone Phone Number: [phone] or [phone] 08/20/2018, 2:27 PM

## 2018-08-20 NOTE — Anesthesia Procedure Notes (Signed)
Procedure Name: Intubation Date/Time: 08/20/2018 10:56 AM Performed by: Bernardo Heater, CRNA Pre-anesthesia Checklist: Patient identified, Emergency Drugs available, Suction available and Patient being monitored Patient Re-evaluated:Patient Re-evaluated prior to induction Oxygen Delivery Method: Circle system utilized Preoxygenation: Pre-oxygenation with 100% oxygen Induction Type: IV induction Ventilation: Mask ventilation without difficulty Laryngoscope Size: Mac and 3 Grade View: Grade III Tube type: Oral Tube size: 7.0 mm Number of attempts: 1 Airway Equipment and Method: Bougie stylet Placement Confirmation: ETT inserted through vocal cords under direct vision,  positive ETCO2 and breath sounds checked- equal and bilateral Secured at: 21 cm Tube secured with: Tape Dental Injury: Teeth and Oropharynx as per pre-operative assessment  Difficulty Due To: Difficult Airway- due to reduced neck mobility Comments: View would possibly better with straight blade

## 2018-08-20 NOTE — Anesthesia Preprocedure Evaluation (Signed)
Anesthesia Evaluation  Patient identified by MRN, date of birth, ID band Patient awake    Reviewed: Allergy & Precautions, H&P , NPO status , Patient's Chart, lab work & pertinent test results, reviewed documented beta blocker date and time   Airway Mallampati: II  TM Distance: >3 FB Neck ROM: full    Dental  (+) Teeth Intact   Pulmonary neg pulmonary ROS, former smoker,    Pulmonary exam normal        Cardiovascular Exercise Tolerance: Good negative cardio ROS Normal cardiovascular exam Rhythm:regular Rate:Normal     Neuro/Psych negative neurological ROS  negative psych ROS   GI/Hepatic Neg liver ROS, PUD,   Endo/Other  negative endocrine ROS  Renal/GU negative Renal ROS  negative genitourinary   Musculoskeletal   Abdominal   Peds  Hematology negative hematology ROS (+)   Anesthesia Other Findings Past Medical History: ~ 02/2013: Bleeding stomach ulcer     Comment:  "4-5 wk ago" (03/19/2013) No date: HOH (hard of hearing)     Comment:  "left ear completely gone; you have to be on right side               facing him for him to hear at all" (03/19/2013) 2014: Prostate cancer [facility]) Past Surgical History: 03/19/2013: ANTERIOR CERVICAL DECOMP/DISCECTOMY FUSION 03/19/2013: ANTERIOR CERVICAL DECOMPRESSION/DISCECTOMY FUSION 4  LEVELS; N/A     Comment:  Procedure: Cervical Three-Four, Cervical Four-Five,               Cervical Five-Six, Cervical Six-Seven anteiror cervical               decompression with fusion with plating and bonegraft;                Surgeon: Floyce Stakes, MD;  Location: [facility];                Service: Neurosurgery;  Laterality: N/A;  ANTERIOR               CERVICAL DECOMPRESSION/DISCECTOMY FUSION 4 LEVELS No date: BACK SURGERY ~ 2010: LUMBAR DISC SURGERY     Comment:  "ruptured disc" (03/19/2013) 2006: PROSTATECTOMY BMI    Body Mass Index:  21.77 kg/m      Reproductive/Obstetrics negative OB ROS                             Anesthesia Physical Anesthesia Plan  ASA: II  Anesthesia Plan: General ETT   Post-op Pain Management:  Regional for Post-op pain   Induction:   PONV Risk Score and Plan:   Airway Management Planned:   Additional Equipment:   Intra-op Plan:   Post-operative Plan:   Informed Consent: I have reviewed the patients History and Physical, chart, labs and discussed the procedure including the risks, benefits and alternatives for the proposed anesthesia with the patient or authorized representative who has indicated his/her understanding and acceptance.     Dental Advisory Given  Plan Discussed with: CRNA  Anesthesia Plan Comments:         Anesthesia Quick Evaluation

## 2018-08-20 NOTE — Evaluation (Signed)
Physical Therapy Evaluation Patient Details Name: Michael Stone. MRN: [id number] DOB: 26-Jul-1935 Today's Date: 08/20/2018   History of Present Illness  83 y/o male s/p R reverse total shoulder  Clinical Impression  Pt did well with POD0 ambulation/mobility.  He had some impulsivity that seemed at least partially related to his poor hearing.  He had good confidence with ambulation at community appropriate speed and was able to easily negotiate up/down steps with single hand rail.  Pt will have wife at home near 24/7 to assist and will be able to safely go home with HHPT.      Follow Up Recommendations Home health PT    Equipment Recommendations       Recommendations for Other Services       Precautions / Restrictions Precautions Precautions: Shoulder Type of Shoulder Precautions: reverse Shoulder Interventions: Shoulder sling/immobilizer Restrictions Weight Bearing Restrictions: Yes RUE Weight Bearing: Non weight bearing      Mobility  Bed Mobility Overal bed mobility: Independent             General bed mobility comments: Pt needed some extra effort, but was able to rise to sitting w/o assist and with good L UE and momentum use  Transfers Overall transfer level: Modified independent Equipment used: None             General transfer comment: Pt able to rise easily and with good confidence  Ambulation/Gait Ambulation/Gait assistance: Modified independent (Device/Increase time) Gait Distance (Feet): 250 Feet Assistive device: None       General Gait Details: Pt with good confidence, safety, cadence and maintained community appropriate speed w/o fatigue.  Stairs Stairs: Yes Stairs assistance: Supervision Stair Management: One rail Right;One rail Left;Alternating pattern Number of Stairs: 4 General stair comments: negotiated up/down 4 steps X 2, once with L rail and once sideways with R rail only  Wheelchair Mobility    Modified Rankin (Stroke  Patients Only)       Balance Overall balance assessment: Independent                                           Pertinent Vitals/Pain Pain Assessment: No/denies pain    Home Living Family/patient expects to be discharged to:: Private residence Living Arrangements: Spouse/significant other Available Help at Discharge: Family Type of Home: House Home Access: Stairs to enter Entrance Stairs-Rails: Left(R available at front porch) Technical brewer of Steps: 3 Home Layout: One level(wife indicated there may be 1 or 2 internal steps with rail)        Prior Function Level of Independence: Independent         Comments: Pt still driving, working, apparently stays very active     Hand Dominance   Dominant Hand: Right    Extremity/Trunk Assessment   Upper Extremity Assessment Upper Extremity Assessment: Overall WFL for tasks assessed(R in sling/immobilizer, has ball grip strength)    Lower Extremity Assessment Lower Extremity Assessment: Overall WFL for tasks assessed       Communication   Communication: HOH(speak on R side of possible)  Cognition Arousal/Alertness: Awake/alert Behavior During Therapy: Flat affect Overall Cognitive Status: Within Functional Limits for tasks assessed                                 General Comments: Unsure it pt was  truly confused or just could not near some cuing/direction/etc      General Comments      Exercises     Assessment/Plan    PT Assessment Patient needs continued PT services  PT Problem List         PT Treatment Interventions DME instruction;Functional mobility training;Stair training;Gait training;Therapeutic activities;Therapeutic exercise;Balance training;Neuromuscular re-education;Patient/family education;Manual techniques    PT Goals (Current goals can be found in the Care Plan section)  Acute Rehab PT Goals Patient Stated Goal: go home ASAP PT Goal Formulation: With  patient Time For Goal Achievement: 09/03/18 Potential to Achieve Goals: Good    Frequency BID   Barriers to discharge        Co-evaluation               AM-PAC PT "6 Clicks" Mobility  Outcome Measure Help needed turning from your back to your side while in a flat bed without using bedrails?: None Help needed moving from lying on your back to sitting on the side of a flat bed without using bedrails?: None Help needed moving to and from a bed to a chair (including a wheelchair)?: None Help needed standing up from a chair using your arms (e.g., wheelchair or bedside chair)?: None Help needed to walk in hospital room?: None Help needed climbing 3-5 steps with a railing? : None 6 Click Score: 24    End of Session Equipment Utilized During Treatment: Gait belt Activity Tolerance: Patient tolerated treatment well Patient left: with chair alarm set;with call bell/phone within reach;with family/visitor present;with nursing/sitter in room Nurse Communication: Mobility status PT Visit Diagnosis: Pain;Muscle weakness (generalized) (M62.81) Pain - Right/Left: Right Pain - part of body: Shoulder    Time: 1600-1630 PT Time Calculation (min) (ACUTE ONLY): 30 min   Charges:   PT Evaluation $PT Eval Low Complexity: 1 Low          Kreg Shropshire, DPT 08/20/2018, 4:44 PM

## 2018-08-20 NOTE — Op Note (Signed)
08/20/2018  1:01 PM  Patient:   Michael Stone.  Pre-Op Diagnosis:   Massive irreparable rotator cuff tear with early cuff arthropathy, right shoulder.  Post-Op Diagnosis:   Same  Procedure:   Reverse right total shoulder arthroplasty.  Surgeon:   Pascal Lux, MD  Assistant:   Cameron Proud, PA-C  Anesthesia:   General endotracheal with an interscalene block using Exparel placed preoperatively by the anesthesiologist.  Findings:   As above.  Complications:   None  EBL:    200 cc  Fluids:   1600 cc crystalloid  UOP:   None  TT:   None  Drains:   None  Closure:   Staples  Implants:   All press-fit Biomet Comprehensive system with a #14 micro-humeral stem, a 40 mm humeral tray with a standard insert, and a mini-base plate with a 40 mm glenosphere.  Brief Clinical Note:   The patient is an 83 year old male with a history of right shoulder pain and weakness.  His symptoms worsened following a fall several months ago. Work-up, including an MRI scan, demonstrated a massive rotator cuff tear with early cuff arthropathy. The patient presents at this time for a reverse right total shoulder arthroplasty.  Procedure:   The patient underwent placement of an interscalene block by the anesthesiologist in the preoperative holding area before being brought into the operating room and lain in the supine position. The patient then underwent general endotracheal intubation and anesthesia before the patient was repositioned in the beach chair position using the beach chair positioner. The right shoulder and upper extremity were prepped with ChloraPrep solution before being draped sterilely. Preoperative antibiotics were administered. A standard anterior approach to the shoulder was made through an approximately 4-5 inch incision. The incision was carried down through the subcutaneous tissues to expose the deltopectoral fascia. The interval between the deltoid and pectoralis muscles was  identified and this plane developed, retracting the cephalic vein laterally with the deltoid muscle. The conjoined tendon was identified. Its lateral margin was dissected and the Kolbel self-retraining retractor inserted. The "three sisters" were identified and cauterized. Bursal tissues were removed to improve visualization. The subscapularis tendon was released from its attachment to the lesser tuberosity 1 cm proximal to its insertion and several tagging sutures placed. The inferior capsule was released with care after identifying and protecting the axillary nerve. The proximal humeral cut was made at approximately 25 of retroversion using the extra-medullary guide.   Attention was redirected to the glenoid. The labrum was debrided circumferentially before the center of the glenoid was marked with electrocautery. The guidewire was drilled into the glenoid neck using the appropriate guide. After verifying its position, it was overreamed with the mini-baseplate reamer to create a flat surface. The permanent mini-baseplate was impacted into place. It was stabilized with a 45 x 6.5 mm central screw and four peripheral screws. A locking screw was placed inferiorly while nonlocking screws were placed anteriorly, superiorly, and posteriorly. The permanent 40 mm glenosphere was then impacted into place and its Morse taper locking mechanism verified using manual distraction.  Attention was directed to the humeral side. The humeral canal was reamed sequentially beginning with the end-cutting reamer then progressing from a 4 mm reamer up to a 14 mm reamer. This provided excellent circumferential chatter. The canal was broached beginning with a #10 broach and progressing to a #14 broach. This was left in place and a trial reduction performed using the standard trial humeral platform. The arm demonstrated  excellent range of motion as the hand could be brought across the chest to the opposite shoulder and brought to the  top of the patient's head and to the patient's ear. The shoulder appeared stable throughout this range of motion. The joint was dislocated and the trial components removed. The permanent #14 micro-stem was impacted into place with care taken to maintain the appropriate version. The permanent 40 mm humeral platform with the standard insert was put together on the back table and impacted into place. Again, the [facility] taper locking mechanism was verified using manual distraction. The shoulder was relocated using two finger pressure and again placed through a range of motion with the findings as described above.  The wound was copiously irrigated with bacitracin saline solution using the jet lavage system before a total of 30 cc of 0.5% Sensorcaine with epinephrine was injected into the pericapsular and peri-incisional tissues to help with postoperative analgesia. The subscapularis tendon was reapproximated using #2 MaxBraid interrupted sutures. The deltopectoral interval was closed using #0 Vicryl interrupted sutures before the subcutaneous tissues were closed using 2-0 Vicryl interrupted sutures. The skin was closed using staples. Prior to closing the skin, 1 g of transexemic acid in 10 cc of normal saline was injected intra-articularly to help with postoperative bleeding. A sterile occlusive dressing was applied to the wound before the arm was placed into a shoulder immobilizer with an abduction pillow. A Polar Care system also was applied to the shoulder. The patient was then transferred back to a hospital bed before being awakened, extubated, and returned to the recovery room in satisfactory condition after tolerating the procedure well.

## 2018-08-20 NOTE — H&P (Signed)
Paper H&P to be scanned into permanent record. H&P reviewed and patient re-examined. No changes. 

## 2018-08-21 ENCOUNTER — Other Ambulatory Visit: Payer: Self-pay | Admitting: Student

## 2018-08-21 DIAGNOSIS — Z96611 Presence of right artificial shoulder joint: Secondary | ICD-10-CM

## 2018-08-21 LAB — CBC WITH DIFFERENTIAL/PLATELET
Abs Immature Granulocytes: 0.03 10*3/uL (ref 0.00–0.07)
Basophils Absolute: 0 10*3/uL (ref 0.0–0.1)
Basophils Relative: 0 %
Eosinophils Absolute: 0 10*3/uL (ref 0.0–0.5)
Eosinophils Relative: 0 %
HCT: 38.7 % — ABNORMAL LOW (ref 39.0–52.0)
Hemoglobin: 12.9 g/dL — ABNORMAL LOW (ref 13.0–17.0)
Immature Granulocytes: 0 %
Lymphocytes Relative: 11 %
Lymphs Abs: 1 10*3/uL (ref 0.7–4.0)
MCH: 29.7 pg (ref 26.0–34.0)
MCHC: 33.3 g/dL (ref 30.0–36.0)
MCV: 89.2 fL (ref 80.0–100.0)
MONOS PCT: 9 %
Monocytes Absolute: 0.9 10*3/uL (ref 0.1–1.0)
Neutro Abs: 7.1 10*3/uL (ref 1.7–7.7)
Neutrophils Relative %: 80 %
Platelets: 206 10*3/uL (ref 150–400)
RBC: 4.34 MIL/uL (ref 4.22–5.81)
RDW: 12.7 % (ref 11.5–15.5)
WBC: 9 10*3/uL (ref 4.0–10.5)
nRBC: 0 % (ref 0.0–0.2)

## 2018-08-21 LAB — BASIC METABOLIC PANEL
Anion gap: 8 (ref 5–15)
BUN: 18 mg/dL (ref 8–23)
CO2: 25 mmol/L (ref 22–32)
Calcium: 8.5 mg/dL — ABNORMAL LOW (ref 8.9–10.3)
Chloride: 106 mmol/L (ref 98–111)
Creatinine, Ser: 1.01 mg/dL (ref 0.61–1.24)
GFR calc Af Amer: >60 mL/min (ref >60)
Glucose, Bld: 139 mg/dL — ABNORMAL HIGH (ref 70–99)
POTASSIUM: 3.9 mmol/L (ref 3.5–5.1)
Sodium: 139 mmol/L (ref 135–145)

## 2018-08-21 MED ORDER — OXYCODONE HCL 5 MG PO TABS: 5.0000 mg | ORAL_TABLET | ORAL | 0 refills | Status: DC | PRN

## 2018-08-21 MED ORDER — TRAMADOL HCL 50 MG PO TABS: 50.0000 mg | ORAL_TABLET | Freq: Four times a day (QID) | ORAL | 0 refills | Status: DC | PRN

## 2018-08-21 MED ORDER — ASPIRIN EC 325 MG PO TBEC: 325.0000 mg | DELAYED_RELEASE_TABLET | Freq: Every day | ORAL | 0 refills | Status: AC

## 2018-08-21 NOTE — Evaluation (Signed)
Occupational Therapy Evaluation Patient Details Name: Michael Stone. MRN: [id number] DOB: August 26, 1935 Today's Date: 08/21/2018    History of Present Illness 83 y/o male s/p R reverse total shoulder   Clinical Impression   Patient was seen for an OT evaluation this date. Pt lives with his spouse in a one level home. Prior to surgery, pt was active and independent. Pt plans to stay at home with family to assist at home while recovering. Pt has orders for RUE to be immobilized and will be NWBing per MD. Patient presents with impaired strength/ROM, pain, and sensation to RUE with block not completely resolved yet. These impairments result in a decreased ability to perform self care tasks requiring mod assist for UB/LB dressing and bathing and max assist for application of polar care, compression stockings, and sling/immobilizer. Pt instructed in polar care mgt, compression stockings mgt, sling/immobilizer mgt, ROM exercises for RUE (with instructions for no shoulder exercises until full sensation has returned), RUE precautions, adaptive strategies for bathing/dressing/toileting/grooming, positioning and considerations for sleep, and home/routines modifications to maximize falls prevention, safety, and independence. Handout provided. OT adjusted sling/immobilizer and polar care to improve comfort, optimize positioning, and to maximize skin integrity/safety. Pt verbalized understanding of all education/training provided. Will discharge from OT services. Please re-consult if additional needs arise. Recommend HHOT services to continue therapy to maximize return to PLOF, address home/routines modifications and safety, minimize falls risk, and minimize caregiver burden.       Follow Up Recommendations  Home health OT    Equipment Recommendations  (TBD)    Recommendations for Other Services       Precautions / Restrictions Precautions Precautions: Shoulder Type of Shoulder Precautions:  reverse Shoulder Interventions: Shoulder sling/immobilizer Restrictions Weight Bearing Restrictions: Yes RUE Weight Bearing: Non weight bearing      Mobility Bed Mobility Overal bed mobility: Independent             General bed mobility comments: deferred pt in recliner at start of session and returned once complete with evaluation. Suspect mod I for bed mobility.   Transfers Overall transfer level: Modified independent Equipment used: None             General transfer comment: Pt able to rise easily and with good confidence    Balance Overall balance assessment: Independent                                         ADL either performed or assessed with clinical judgement   ADL Overall ADL's : Needs assistance/impaired                                       General ADL Comments: Pt require MAX assist for UB dressing, bathing, and to don polar care/sling due to immobilization of RUE. Otherwise at baseline PLOF.      Vision Baseline Vision/History: Wears glasses Wears Glasses: At all times Patient Visual Report: No change from baseline       Perception     Praxis      Pertinent Vitals/Pain Pain Assessment: No/denies pain(endorses block still in place)     Hand Dominance Right   Extremity/Trunk Assessment Upper Extremity Assessment Upper Extremity Assessment: RUE deficits/detail RUE Deficits / Details: s/p R TSA, NWB RUE: Unable to fully assess due to immobilization  Lower Extremity Assessment Lower Extremity Assessment: Overall WFL for tasks assessed;Defer to PT evaluation   Cervical / Trunk Assessment Cervical / Trunk Assessment: Normal   Communication Communication Communication: HOH   Cognition Arousal/Alertness: Awake/alert Behavior During Therapy: WFL for tasks assessed/performed Overall Cognitive Status: Within Functional Limits for tasks assessed                                      General Comments       Exercises Other Exercises Other Exercises: Pt and caregiver educated in falls prevention strategies, polar care mgt, sling/immobilizer mgt, safe positioning strategies, safe ROM exercises for hand and wrist (with instruction not to engage in ROM exercises until full sensation returns to RUE), and strategies for ADL mgt after reverse TSA.   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), apparently stays very active        OT Problem List: Decreased strength;Decreased knowledge of precautions;Decreased range of motion;Decreased safety awareness;Impaired UE functional use;Impaired sensation;Decreased knowledge of use of DME or AE;Decreased coordination;Decreased activity tolerance      OT Treatment/Interventions:      OT Goals(Current goals can be found in the care plan section) Acute Rehab OT Goals Patient Stated Goal: go home ASAP OT Goal Formulation: All assessment and education complete, DC therapy Time For Goal Achievement: 08/21/18 Potential to Achieve Goals: Good  OT Frequency:     Barriers to D/C:            Co-evaluation              AM-PAC OT "6 Clicks" Daily Activity     Outcome Measure Help from another person eating meals?: None Help from another person taking care of personal grooming?: A Little Help from another person toileting, which includes using toliet, bedpan, or urinal?: A Little Help from another person  bathing (including washing, rinsing, drying)?: A Lot Help from another person to put on and taking off regular upper body clothing?: A Lot Help from another person to put on and taking off regular lower body clothing?: A Little 6 Click Score: 17   End of Session Equipment Utilized During Treatment: Gait belt;Other (comment)(sling/immobilizer, polar care. ) Nurse Communication: Other (comment)(Pt clear by OT for DC)  Activity Tolerance: Patient tolerated treatment well;No increased pain Patient left: in chair;with call bell/phone within reach;with chair alarm set;with family/visitor present;Other (comment)(With polar care in place, with sling/immobilizer in place.)  OT Visit Diagnosis: Other abnormalities of gait and mobility (R26.89);History of falling (Z91.81)                Time: 8341-9622 OT Time Calculation (min): 46 min Charges:  OT General Charges $OT Visit: 1 Visit OT Evaluation $OT Eval Low Complexity: 1 Low OT Treatments $Self Care/Home Management : 38-52 mins  Shara Blazing, M.S., OTR/L Ascom: [phone] 08/21/18, 10:34 AM

## 2018-08-21 NOTE — Progress Notes (Signed)
Subjective: 1 Day Post-Op Procedure(s) (LRB): REVERSE RIGHT TOTAL SHOULDER ARTHROPLASTY (Right) Patient reports pain as 0 on 0-10 scale.   Patient is well, and has had no acute complaints or problems Plan is to go Home after hospital stay. Negative for chest pain and shortness of breath Fever: no Gastrointestinal:Negative for nausea and vomiting  Objective: Vital signs in last 24 hours: Temp:  [96.7 F (35.9 C)-98.6 F (37 C)] 97.7 F (36.5 C) (03/18 0447) Pulse Rate:  [61-98] 75 (03/18 0447) Resp:  [8-20] 19 (03/18 0447) BP: (116-189)/(65-99) 136/79 (03/18 0447) SpO2:  [93 %-99 %] 94 % (03/18 0447) Weight:  [70.8 kg] 70.8 kg (03/17 0805)  Intake/Output from previous day:  Intake/Output Summary (Last 24 hours) at 08/21/2018 0756 Last data filed at 08/21/2018 0237 Gross per 24 hour  Intake 2369.45 ml  Output 200 ml  Net 2169.45 ml    Intake/Output this shift: No intake/output data recorded.  Labs: Recent Labs    08/21/18 0406  HGB 12.9*   Recent Labs    08/21/18 0406  WBC 9.0  RBC 4.34  HCT 38.7*  PLT 206   Recent Labs    08/21/18 0406  NA 139  K 3.9  CL 106  CO2 25  BUN 18  CREATININE 1.01  GLUCOSE 139*  CALCIUM 8.5*   No results for input(s): LABPT, INR in the last 72 hours.   EXAM General - Patient is Alert, Appropriate and Oriented Extremity - ABD soft Incision: dressing C/D/I No cellulitis present Dressing/Incision - clean, dry, no drainage Patient has decreased sensation to light touch to the right arm this morning, block still in effect. Motor Function - intact, moving foot and toes well on exam.   Past Medical History:  Diagnosis Date  . Bleeding stomach ulcer ~ 02/2013   "4-5 wk ago" (03/19/2013)  . HOH (hard of hearing)    "left ear completely gone; you have to be on right side facing him for him to hear at all" (03/19/2013)  . Prostate cancer ([place]) 2014    Assessment/Plan: 1 Day Post-Op Procedure(s) (LRB): REVERSE RIGHT TOTAL  SHOULDER ARTHROPLASTY (Right) Active Problems:   Status post reverse total shoulder replacement, right  Estimated body mass index is 21.77 kg/m as calculated from the following:   Height as of this encounter: 5\' 11"  (1.803 m).   Weight as of this encounter: 70.8 kg. Advance diet Up with therapy D/C IV fluids when tolerating po intake.  Labs reviewed this AM. Patient has walked over 250 feet yesterday. Plan for discharge home today.  DVT Prophylaxis - Lovenox, Foot Pumps and TED hose Non-weightbearing to the right arm.  Raquel James, PA-C [facility] 08/21/2018, 7:56 AM

## 2018-08-21 NOTE — Discharge Instructions (Signed)
Diet: As you were doing prior to hospitalization   Shower:  May shower but keep the wounds dry, use an occlusive plastic wrap, NO SOAKING IN TUB.  If the bandage gets wet, change with a clean dry gauze.  Dressing:  You may change your dressing as needed. Change the dressing with sterile gauze dressing.    Activity:  Increase activity slowly as tolerated, but follow the weight bearing instructions below.  No lifting or driving for 6 weeks.  Weight Bearing:   Non-weightbearing to the right arm.  Blood Clot Prevention: Take 1 325mg  aspirin daily for 14 days following surgery.  To prevent constipation: you may use a stool softener such as -  Colace (over the counter) 100 mg by mouth twice a day  Drink plenty of fluids (prune juice may be helpful) and high fiber foods Miralax (over the counter) for constipation as needed.    Itching:  If you experience itching with your medications, try taking only a single pain pill, or even half a pain pill at a time.  You may take up to 10 pain pills per day, and you can also use benadryl over the counter for itching or also to help with sleep.   Precautions:  If you experience chest pain or shortness of breath - call 911 immediately for transfer to the hospital emergency department!!  If you develop a fever greater that 101 F, purulent drainage from wound, increased redness or drainage from wound, or calf pain-Call [facility]                                              Follow- Up Appointment:  Please call for an appointment to be seen in 2 weeks at [facility]

## 2018-08-21 NOTE — Progress Notes (Signed)
Physical Therapy Treatment Patient Details Name: Michael Stone. MRN: [id number] DOB: 10/17/1935 Today's Date: 08/21/2018    History of Present Illness 83 y/o male s/p R reverse total shoulder    PT Comments    Pt stood and ambulated around unit x 1 with ease and good speed.  Some verbal cues to slow down but no LOB or balance.  Pt seated in transport chair after gait for discharge.  Nurse tech in room.  Follow Up Recommendations  Home health PT     Equipment Recommendations       Recommendations for Other Services       Precautions / Restrictions Precautions Precautions: Shoulder Type of Shoulder Precautions: reverse Shoulder Interventions: Shoulder sling/immobilizer Restrictions Weight Bearing Restrictions: Yes RUE Weight Bearing: Non weight bearing    Mobility  Bed Mobility Overal bed mobility: Independent                Transfers Overall transfer level: Modified independent                  Ambulation/Gait Ambulation/Gait assistance: Modified independent (Device/Increase time) Gait Distance (Feet): 250 Feet Assistive device: None       General Gait Details: Pt with good confidence, safety, cadence and maintained community appropriate speed w/o fatigue.   Stairs         General stair comments: declined today - did yesterday   Wheelchair Mobility    Modified Rankin (Stroke Patients Only)       Balance Overall balance assessment: Independent                                          Cognition Arousal/Alertness: Awake/alert Behavior During Therapy: WFL for tasks assessed/performed                                          Exercises      General Comments        Pertinent Vitals/Pain Pain Assessment: No/denies pain    Home Living                      Prior Function            PT Goals (current goals can now be found in the care plan section) Progress towards PT goals:  Progressing toward goals    Frequency    BID      PT Plan Current plan remains appropriate    Co-evaluation              AM-PAC PT "6 Clicks" Mobility   Outcome Measure  Help needed turning from your back to your side while in a flat bed without using bedrails?: None Help needed moving from lying on your back to sitting on the side of a flat bed without using bedrails?: None Help needed moving to and from a bed to a chair (including a wheelchair)?: None Help needed standing up from a chair using your arms (e.g., wheelchair or bedside chair)?: None Help needed to walk in hospital room?: None Help needed climbing 3-5 steps with a railing? : None 6 Click Score: 24    End of Session Equipment Utilized During Treatment: Gait belt Activity Tolerance: Patient tolerated treatment well Patient left: Other (comment)   Pain -  part of body: Shoulder     Time: 4834-7583 PT Time Calculation (min) (ACUTE ONLY): 8 min  Charges:  $Gait Training: 8-22 mins                    Chesley Noon, PTA 08/21/18, 9:59 AM

## 2018-08-21 NOTE — Progress Notes (Signed)
Discharge instructions and prescriptions given to pt and wife. OT working with patient and getting him dressed. IV removed. Pt will discharge home with wife.

## 2018-08-21 NOTE — Discharge Summary (Signed)
Physician Discharge Summary  Patient ID: Michael Stone. MRN: [id number] DOB/AGE: [birth year] 81 y.o.  Admit date: 08/20/2018 Discharge date: 08/21/2018  Admission Diagnoses:  [place] CUFF  Discharge Diagnoses: Patient Active Problem List   Diagnosis Date Noted  . Status post reverse total shoulder replacement, right 08/20/2018    Past Medical History:  Diagnosis Date  . Bleeding stomach ulcer ~ 02/2013   "4-5 wk ago" (03/19/2013)  . HOH (hard of hearing)    "left ear completely gone; you have to be on right side facing him for him to hear at all" (03/19/2013)  . Prostate cancer ([place]) 2014     Transfusion: None.   Consultants (if any):   Discharged Condition: Improved  Hospital Course: Michael Stone. is an 83 y.o. male who was admitted 08/20/2018 with a diagnosis of a massive irreparable rotator cuff tear with early cuff arthropathy of the right shoulder and went to the operating room on 08/20/2018 and underwent the above named procedures.    Surgeries: Procedure(s): REVERSE RIGHT TOTAL SHOULDER ARTHROPLASTY on 08/20/2018 Patient tolerated the surgery well. Taken to PACU where she was stabilized and then transferred to the orthopedic floor.  Started on Lovenox 40mg  q 24 hrs. Foot pumps applied bilaterally at 80 mm. Heels elevated on bed with rolled towels. No evidence of DVT. Negative Homan. Physical therapy started on day #1 for gait training and transfer. OT started day #1 for ADL and assisted devices.  Patient's IV was d/c on POD1.  Implants: All press-fit [employer] Comprehensive system with a #14 micro-humeral stem, a 40 mm humeral tray with a standard insert, and a mini-base plate with a 40 mm glenosphere.  He was given perioperative antibiotics:  Anti-infectives (From admission, onward)   Start     Dose/Rate Route Frequency Ordered Stop   08/20/18 1700  ceFAZolin (ANCEF) IVPB 2g/100 mL premix     2 g 200 mL/hr over 30 Minutes  Intravenous Every 6 hours 08/20/18 1409 08/21/18 0536   08/20/18 0742  ceFAZolin (ANCEF) 2-4 GM/100ML-% IVPB    Note to Pharmacy:  Ronnell Freshwater   : cabinet override      08/20/18 0742 08/20/18 1107   08/19/18 2145  ceFAZolin (ANCEF) IVPB 2g/100 mL premix     2 g 200 mL/hr over 30 Minutes Intravenous  Once 08/19/18 2141 08/20/18 1119    .  He was given sequential compression devices, early ambulation, and Lovenox for DVT prophylaxis.  He benefited maximally from the hospital stay and there were no complications.    Recent vital signs:  Vitals:   08/21/18 0447 08/21/18 0759  BP: 136/79 (!) 161/65  Pulse: 75 (!) 40  Resp: 19 18  Temp: 97.7 F (36.5 C) 97.6 F (36.4 C)  SpO2: 94% 96%    Recent laboratory studies:  Lab Results  Component Value Date   HGB 12.9 (L) 08/21/2018   HGB 14.6 08/13/2018   HGB 15.2 03/17/2013   Lab Results  Component Value Date   WBC 9.0 08/21/2018   PLT 206 08/21/2018   No results found for: INR Lab Results  Component Value Date   NA 139 08/21/2018   K 3.9 08/21/2018   CL 106 08/21/2018   CO2 25 08/21/2018   BUN 18 08/21/2018   CREATININE 1.01 08/21/2018   GLUCOSE 139 (H) 08/21/2018    Discharge Medications:   Allergies as of 08/21/2018   No Known Allergies     Medication List  TAKE these medications   aspirin EC 325 MG tablet Take 1 tablet (325 mg total) by mouth daily. What changed:    medication strength  how much to take  when to take this  reasons to take this   naproxen sodium 220 MG tablet Commonly known as:  ALEVE Take 220 mg by mouth daily as needed (pain).   oxyCODONE 5 MG immediate release tablet Commonly known as:  Oxy IR/ROXICODONE Take 1-2 tablets (5-10 mg total) by mouth every 4 (four) hours as needed for moderate pain.   traMADol 50 MG tablet Commonly known as:  ULTRAM Take 1 tablet (50 mg total) by mouth every 6 (six) hours as needed for moderate pain.      Diagnostic Studies: Dg Shoulder  Right Port  Result Date: 08/20/2018 CLINICAL DATA:  83 year old male post right shoulder replacement. Initial encounter. EXAM: PORTABLE RIGHT SHOULDER COMPARISON:  07/01/2018 MR. FINDINGS: Post reversed total right shoulder replacement which appears in satisfactory position without complication noted on submitted images. Mild acromioclavicular joint degenerative changes. Slight spurring undersurface of the acromion. Prior cervical spine surgery. IMPRESSION: Post reversed total right shoulder replacement which appears in satisfactory position without complication noted on submitted images. Electronically Signed   By: Genia Del M.D.   On: 08/20/2018 14:02   Disposition: Plan for discharge home today.  Follow-up Information    Lattie Corns, PA-C Follow up in 14 day(s).   Specialty:  Physician Assistant Why:  Electa Sniff information: [place] [address] [phone]          Signed: Judson Roch PA-C 08/21/2018, 8:01 AM

## 2018-08-21 NOTE — TOC Transition Note (Signed)
Transition of Care (TOC) - CM/SW Discharge Note  Patient has chosen [facility] for Home Health PT services, Patient discharging home today.    Patient Details  Name: Michael Stone. MRN: [id number] Date of Birth: [birth year]  Transition of Care [facility] CM/SW Contact:  Su Hilt, RN Phone Number: 08/21/2018, 8:32 AM   Clinical Narrative:       Final next level of care: Home w Home Health Services Barriers to Discharge: No Barriers Identified   Patient Goals and CMS Choice Patient states their goals for this hospitalization and ongoing recovery are:: go home CMS Medicare.gov Compare Post Acute Care list provided to:: Patient Choice offered to / list presented to : Patient  Discharge Placement                       Discharge Plan and Services Discharge Planning Services: CM Consult Post Acute Care Choice: Home Health              HH Arranged: PT, OT [facility] Agency: [facility]   Social Determinants of Health (SDOH) Interventions     Readmission Risk Interventions No flowsheet data found.

## 2018-08-22 DIAGNOSIS — M75121 Complete rotator cuff tear or rupture of right shoulder, not specified as traumatic: Secondary | ICD-10-CM | POA: Diagnosis not present

## 2018-08-22 DIAGNOSIS — H9192 Unspecified hearing loss, left ear: Secondary | ICD-10-CM | POA: Diagnosis not present

## 2018-08-22 DIAGNOSIS — Z96611 Presence of right artificial shoulder joint: Secondary | ICD-10-CM | POA: Diagnosis not present

## 2018-08-22 DIAGNOSIS — Z471 Aftercare following joint replacement surgery: Secondary | ICD-10-CM | POA: Diagnosis not present

## 2018-08-22 DIAGNOSIS — Z79891 Long term (current) use of opiate analgesic: Secondary | ICD-10-CM | POA: Diagnosis not present

## 2018-08-22 DIAGNOSIS — C61 Malignant neoplasm of prostate: Secondary | ICD-10-CM | POA: Diagnosis not present

## 2018-08-22 DIAGNOSIS — Z7982 Long term (current) use of aspirin: Secondary | ICD-10-CM | POA: Diagnosis not present

## 2018-08-22 LAB — SURGICAL PATHOLOGY

## 2018-08-29 NOTE — Anesthesia Postprocedure Evaluation (Signed)
Anesthesia Post Note  Patient: Michael Stone.  Procedure(s) Performed: REVERSE RIGHT TOTAL SHOULDER ARTHROPLASTY (Right Shoulder)  Patient location during evaluation: PACU Anesthesia Type: General Level of consciousness: awake and alert Pain management: pain level controlled Vital Signs Assessment: post-procedure vital signs reviewed and stable Respiratory status: spontaneous breathing, nonlabored ventilation, respiratory function stable and patient connected to nasal cannula oxygen Cardiovascular status: blood pressure returned to baseline and stable Postop Assessment: no apparent nausea or vomiting Anesthetic complications: no     Last Vitals:  Vitals:   08/21/18 0447 08/21/18 0759  BP: 136/79 (!) 161/65  Pulse: 75 (!) 40  Resp: 19 18  Temp: 36.5 C 36.4 C  SpO2: 94% 96%    Last Pain:  Vitals:   08/21/18 0759  TempSrc: Oral  PainSc: 0-No pain                 Molli Barrows

## 2018-09-04 DIAGNOSIS — Z96611 Presence of right artificial shoulder joint: Secondary | ICD-10-CM | POA: Diagnosis not present

## 2018-09-04 DIAGNOSIS — M25611 Stiffness of right shoulder, not elsewhere classified: Secondary | ICD-10-CM | POA: Diagnosis not present

## 2018-09-04 DIAGNOSIS — M6281 Muscle weakness (generalized): Secondary | ICD-10-CM | POA: Diagnosis not present

## 2018-09-04 DIAGNOSIS — G8929 Other chronic pain: Secondary | ICD-10-CM | POA: Diagnosis not present

## 2018-09-04 DIAGNOSIS — M25511 Pain in right shoulder: Secondary | ICD-10-CM | POA: Diagnosis not present

## 2018-09-11 DIAGNOSIS — I10 Essential (primary) hypertension: Secondary | ICD-10-CM | POA: Diagnosis not present

## 2018-09-11 DIAGNOSIS — M2560 Stiffness of unspecified joint, not elsewhere classified: Secondary | ICD-10-CM | POA: Diagnosis not present

## 2018-09-11 DIAGNOSIS — H539 Unspecified visual disturbance: Secondary | ICD-10-CM | POA: Diagnosis not present

## 2018-09-11 DIAGNOSIS — Z96611 Presence of right artificial shoulder joint: Secondary | ICD-10-CM | POA: Diagnosis not present

## 2018-09-11 DIAGNOSIS — M722 Plantar fascial fibromatosis: Secondary | ICD-10-CM | POA: Diagnosis not present

## 2018-09-11 DIAGNOSIS — M6281 Muscle weakness (generalized): Secondary | ICD-10-CM | POA: Diagnosis not present

## 2018-09-11 DIAGNOSIS — M542 Cervicalgia: Secondary | ICD-10-CM | POA: Diagnosis not present

## 2018-09-11 DIAGNOSIS — I480 Paroxysmal atrial fibrillation: Secondary | ICD-10-CM | POA: Diagnosis not present

## 2018-09-11 DIAGNOSIS — F17200 Nicotine dependence, unspecified, uncomplicated: Secondary | ICD-10-CM | POA: Diagnosis not present

## 2018-09-11 DIAGNOSIS — M545 Low back pain: Secondary | ICD-10-CM | POA: Diagnosis not present

## 2018-09-11 DIAGNOSIS — Z23 Encounter for immunization: Secondary | ICD-10-CM | POA: Diagnosis not present

## 2018-09-12 DIAGNOSIS — Z471 Aftercare following joint replacement surgery: Secondary | ICD-10-CM | POA: Diagnosis not present

## 2018-09-18 DIAGNOSIS — Z96611 Presence of right artificial shoulder joint: Secondary | ICD-10-CM | POA: Diagnosis not present

## 2018-09-18 DIAGNOSIS — M6281 Muscle weakness (generalized): Secondary | ICD-10-CM | POA: Diagnosis not present

## 2018-09-25 DIAGNOSIS — M6281 Muscle weakness (generalized): Secondary | ICD-10-CM | POA: Diagnosis not present

## 2018-09-25 DIAGNOSIS — G8929 Other chronic pain: Secondary | ICD-10-CM | POA: Diagnosis not present

## 2018-09-25 DIAGNOSIS — M25611 Stiffness of right shoulder, not elsewhere classified: Secondary | ICD-10-CM | POA: Diagnosis not present

## 2018-09-25 DIAGNOSIS — M25511 Pain in right shoulder: Secondary | ICD-10-CM | POA: Diagnosis not present

## 2018-09-25 DIAGNOSIS — Z96611 Presence of right artificial shoulder joint: Secondary | ICD-10-CM | POA: Diagnosis not present

## 2018-10-02 DIAGNOSIS — M25511 Pain in right shoulder: Secondary | ICD-10-CM | POA: Diagnosis not present

## 2018-10-02 DIAGNOSIS — G8929 Other chronic pain: Secondary | ICD-10-CM | POA: Diagnosis not present

## 2018-10-02 DIAGNOSIS — M6281 Muscle weakness (generalized): Secondary | ICD-10-CM | POA: Diagnosis not present

## 2018-10-02 DIAGNOSIS — M25611 Stiffness of right shoulder, not elsewhere classified: Secondary | ICD-10-CM | POA: Diagnosis not present

## 2018-10-02 DIAGNOSIS — Z96611 Presence of right artificial shoulder joint: Secondary | ICD-10-CM | POA: Diagnosis not present

## 2018-10-07 DIAGNOSIS — M75121 Complete rotator cuff tear or rupture of right shoulder, not specified as traumatic: Secondary | ICD-10-CM | POA: Diagnosis not present

## 2018-10-07 DIAGNOSIS — M12811 Other specific arthropathies, not elsewhere classified, right shoulder: Secondary | ICD-10-CM | POA: Diagnosis not present

## 2018-10-09 DIAGNOSIS — M6281 Muscle weakness (generalized): Secondary | ICD-10-CM | POA: Diagnosis not present

## 2018-10-09 DIAGNOSIS — Z96611 Presence of right artificial shoulder joint: Secondary | ICD-10-CM | POA: Diagnosis not present

## 2018-10-09 DIAGNOSIS — M25611 Stiffness of right shoulder, not elsewhere classified: Secondary | ICD-10-CM | POA: Diagnosis not present

## 2018-10-09 DIAGNOSIS — G8929 Other chronic pain: Secondary | ICD-10-CM | POA: Diagnosis not present

## 2018-10-09 DIAGNOSIS — M25511 Pain in right shoulder: Secondary | ICD-10-CM | POA: Diagnosis not present

## 2018-10-16 DIAGNOSIS — Z96611 Presence of right artificial shoulder joint: Secondary | ICD-10-CM | POA: Diagnosis not present

## 2018-11-26 DIAGNOSIS — K269 Duodenal ulcer, unspecified as acute or chronic, without hemorrhage or perforation: Secondary | ICD-10-CM | POA: Diagnosis not present

## 2018-11-26 DIAGNOSIS — K2950 Unspecified chronic gastritis without bleeding: Secondary | ICD-10-CM | POA: Diagnosis not present

## 2018-11-26 DIAGNOSIS — Z800 Family history of malignant neoplasm of digestive organs: Secondary | ICD-10-CM | POA: Diagnosis not present

## 2018-11-26 DIAGNOSIS — K5900 Constipation, unspecified: Secondary | ICD-10-CM | POA: Diagnosis not present

## 2018-12-10 ENCOUNTER — Other Ambulatory Visit: Payer: Self-pay | Admitting: Student

## 2018-12-10 DIAGNOSIS — R1084 Generalized abdominal pain: Secondary | ICD-10-CM | POA: Diagnosis not present

## 2018-12-20 ENCOUNTER — Other Ambulatory Visit: Payer: Self-pay

## 2018-12-20 ENCOUNTER — Ambulatory Visit
Admission: RE | Admit: 2018-12-20 | Discharge: 2018-12-20 | Disposition: A | Discharge status: Home / Self Care | Payer: PPO | Source: Ambulatory Visit | Attending: Student | Admitting: Student

## 2018-12-20 DIAGNOSIS — K7689 Other specified diseases of liver: Secondary | ICD-10-CM | POA: Diagnosis not present

## 2018-12-20 DIAGNOSIS — R1084 Generalized abdominal pain: Secondary | ICD-10-CM | POA: Diagnosis not present

## 2018-12-20 DIAGNOSIS — K5730 Diverticulosis of large intestine without perforation or abscess without bleeding: Secondary | ICD-10-CM | POA: Diagnosis not present

## 2018-12-20 MED ORDER — IOHEXOL 300 MG/ML  SOLN
100.0000 mL | Freq: Once | INTRAMUSCULAR | Status: AC | PRN
  Administered 2018-12-20: 09:00:00 100 mL via INTRAVENOUS

## 2019-01-02 ENCOUNTER — Other Ambulatory Visit: Payer: Self-pay

## 2019-01-07 DIAGNOSIS — M722 Plantar fascial fibromatosis: Secondary | ICD-10-CM | POA: Diagnosis not present

## 2019-02-26 ENCOUNTER — Ambulatory Visit: Admit: 2019-02-26 | Payer: PPO | Admitting: Internal Medicine

## 2019-02-26 SURGERY — ESOPHAGOGASTRODUODENOSCOPY (EGD) WITH PROPOFOL
Anesthesia: General

## 2019-06-26 DIAGNOSIS — M25512 Pain in left shoulder: Secondary | ICD-10-CM | POA: Diagnosis not present

## 2019-06-26 DIAGNOSIS — M19012 Primary osteoarthritis, left shoulder: Secondary | ICD-10-CM | POA: Diagnosis not present

## 2019-07-04 DIAGNOSIS — R110 Nausea: Secondary | ICD-10-CM | POA: Diagnosis not present

## 2019-07-04 DIAGNOSIS — R6883 Chills (without fever): Secondary | ICD-10-CM | POA: Diagnosis not present

## 2019-07-04 DIAGNOSIS — N390 Urinary tract infection, site not specified: Secondary | ICD-10-CM | POA: Diagnosis not present

## 2019-07-04 DIAGNOSIS — U071 COVID-19: Secondary | ICD-10-CM | POA: Diagnosis not present

## 2019-07-04 DIAGNOSIS — Z03818 Encounter for observation for suspected exposure to other biological agents ruled out: Secondary | ICD-10-CM | POA: Diagnosis not present

## 2020-03-09 DIAGNOSIS — M722 Plantar fascial fibromatosis: Secondary | ICD-10-CM | POA: Diagnosis not present

## 2020-10-04 DIAGNOSIS — H903 Sensorineural hearing loss, bilateral: Secondary | ICD-10-CM | POA: Diagnosis not present

## 2021-02-28 ENCOUNTER — Encounter: Payer: Self-pay | Admitting: Family Medicine

## 2021-02-28 ENCOUNTER — Ambulatory Visit (INDEPENDENT_AMBULATORY_CARE_PROVIDER_SITE_OTHER): Payer: PPO | Admitting: Family Medicine

## 2021-02-28 ENCOUNTER — Other Ambulatory Visit: Payer: Self-pay

## 2021-02-28 VITALS — BP 137/79 | HR 74 | Ht 71.0 in | Wt 163.0 lb

## 2021-02-28 DIAGNOSIS — Z8719 Personal history of other diseases of the digestive system: Secondary | ICD-10-CM | POA: Diagnosis not present

## 2021-02-28 DIAGNOSIS — Z87891 Personal history of nicotine dependence: Secondary | ICD-10-CM | POA: Diagnosis not present

## 2021-02-28 DIAGNOSIS — H6123 Impacted cerumen, bilateral: Secondary | ICD-10-CM | POA: Diagnosis not present

## 2021-02-28 DIAGNOSIS — Z136 Encounter for screening for cardiovascular disorders: Secondary | ICD-10-CM

## 2021-02-28 DIAGNOSIS — Z8546 Personal history of malignant neoplasm of prostate: Secondary | ICD-10-CM | POA: Insufficient documentation

## 2021-02-28 LAB — URINALYSIS, ROUTINE W REFLEX MICROSCOPIC
Bilirubin, UA: NEGATIVE
Glucose, UA: NEGATIVE
Ketones, UA: NEGATIVE
Leukocytes,UA: NEGATIVE
Nitrite, UA: NEGATIVE
Protein,UA: NEGATIVE
Specific Gravity, UA: 1.025 (ref 1.005–1.030)
Urobilinogen, Ur: 0.2 mg/dL (ref 0.2–1.0)
pH, UA: 5 (ref 5.0–7.5)

## 2021-02-28 LAB — MICROSCOPIC EXAMINATION
Bacteria, UA: NONE SEEN
WBC, UA: NONE SEEN /hpf (ref 0–5)

## 2021-02-28 NOTE — Assessment & Plan Note (Signed)
S/p prostatectomy in 2006. Will check labs today. Continue to monitor. Call with any concerns. Continue to monitor.

## 2021-02-28 NOTE — Progress Notes (Signed)
BP 137/79   Pulse 74   Ht 5\' 11"  (1.803 m)   Wt 163 lb (73.9 kg)   BMI 22.73 kg/m    Subjective:    Patient ID: Michael Girt., male    DOB: 1935-11-05, 85 y.o.   MRN: [id number]  HPI: Michael Petruzzi. is a 85 y.o. male who presents today to establish care.   Chief Complaint  Patient presents with   Hearing Problem   Cerumen Impaction   New Patient (Initial Visit)   Michael Stone is a 85yo male who presents today to establish care. He has not seen a primary doctor in many years. He doesn't remember when he had a physical or labs. He usually goes to see the walk in clinic when he has an issue. He got frustrated about having to wait there, so came in to establish care.   EAG CLOGGED Duration:  2-3 weeks Involved ear(s):  "right Sensation of feeling clogged/plugged: yes Decreased/muffled hearing:yes Ear pain: no Fever: no Otorrhea: no Hearing loss: yes Upper respiratory infection symptoms: no Using Q-Tips: no Status: worse History of cerumenosis: no Treatments attempted: none   Active Ambulatory Problems    Diagnosis Date Noted   History of prostate cancer 02/28/2021   Resolved Ambulatory Problems    Diagnosis Date Noted   Status post reverse total shoulder replacement, right 08/20/2018   Past Medical History:  Diagnosis Date   Bleeding stomach ulcer ~ 02/2013   HOH (hard of hearing)    Prostate cancer ([place]) 2014   Past Surgical History:  Procedure Laterality Date   ANTERIOR CERVICAL DECOMP/DISCECTOMY FUSION  03/19/2013   ANTERIOR CERVICAL DECOMPRESSION/DISCECTOMY FUSION 4 LEVELS N/A 03/19/2013   Procedure: Cervical Three-Four, Cervical Four-Five, Cervical Five-Six, Cervical Six-Seven anteiror cervical decompression with fusion with plating and bonegraft;  Surgeon: Floyce Stakes, MD;  Location: [place];  Service: Neurosurgery;  Laterality: N/A;  ANTERIOR CERVICAL DECOMPRESSION/DISCECTOMY FUSION 4 LEVELS   BACK SURGERY     LUMBAR DISC SURGERY  ~  2010   "ruptured disc" (03/19/2013)   PROSTATECTOMY  2006   REVERSE SHOULDER ARTHROPLASTY Right 08/20/2018   Procedure: REVERSE RIGHT TOTAL SHOULDER ARTHROPLASTY;  Surgeon: Corky Mull, MD;  Location: [facility] ORS;  Service: Orthopedics;  Laterality: Right;   Outpatient Encounter Medications as of 02/28/2021  Medication Sig   aspirin EC 325 MG tablet Take 1 tablet (325 mg total) by mouth daily.   naproxen sodium (ALEVE) 220 MG tablet Take 220 mg by mouth daily as needed (pain).   [DISCONTINUED] oxyCODONE (OXY IR/ROXICODONE) 5 MG immediate release tablet Take 1-2 tablets (5-10 mg total) by mouth every 4 (four) hours as needed for moderate pain.   [DISCONTINUED] traMADol (ULTRAM) 50 MG tablet Take 1 tablet (50 mg total) by mouth every 6 (six) hours as needed for moderate pain.   No facility-administered encounter medications on file as of 02/28/2021.   No Known Allergies  Social History   Socioeconomic History   Marital status: Married    Spouse name: Barnett Applebaum   Number of children: Not on file   Years of education: Not on file   Highest education level: Not on file  Occupational History   Occupation: still working!!!  Tobacco Use   Smoking status: Former    Types: Pipe    Quit date: 03/18/1983    Years since quitting: 37.9   Smokeless tobacco: Never  Vaping Use   Vaping Use: Never used  Substance and Sexual Activity  Alcohol use: No   Drug use: No   Sexual activity: Never  Other Topics Concern   Not on file  Social History Narrative   Not on file   Social Determinants of Health   Financial Resource Strain: Not on file  Food Insecurity: Not on file  Transportation Needs: Not on file  Physical Activity: Not on file  Stress: Not on file  Social Connections: Not on file   History reviewed. No pertinent family history.   Review of Systems  Constitutional: Negative.   HENT:  Positive for hearing loss. Negative for congestion, dental problem, drooling, ear discharge, ear  pain, facial swelling, mouth sores, nosebleeds, postnasal drip, rhinorrhea, sinus pressure, sinus pain, sneezing, sore throat, tinnitus, trouble swallowing and voice change.   Eyes: Negative.   Respiratory: Negative.    Cardiovascular: Negative.   Gastrointestinal: Negative.   Endocrine: Negative.   Genitourinary: Negative.   Musculoskeletal: Negative.   Skin: Negative.   Allergic/Immunologic: Negative.   Neurological: Negative.   Psychiatric/Behavioral: Negative.     Per HPI unless specifically indicated above     Objective:    BP 137/79   Pulse 74   Ht 5\' 11"  (1.803 m)   Wt 163 lb (73.9 kg)   BMI 22.73 kg/m   Wt Readings from Last 3 Encounters:  02/28/21 163 lb (73.9 kg)  08/20/18 156 lb 1 oz (70.8 kg)  08/13/18 156 lb 1 oz (70.8 kg)    Physical Exam Vitals and nursing note reviewed.  Constitutional:      General: He is not in acute distress.    Appearance: Normal appearance. He is normal weight. He is not ill-appearing, toxic-appearing or diaphoretic.  HENT:     Head: Normocephalic and atraumatic.     Right Ear: External ear normal. There is impacted cerumen.     Left Ear: External ear normal. There is impacted cerumen.     Nose: Nose normal.     Mouth/Throat:     Mouth: Mucous membranes are moist.     Pharynx: Oropharynx is clear.  Eyes:     General: No scleral icterus.       Right eye: No discharge.        Left eye: No discharge.     Extraocular Movements: Extraocular movements intact.     Conjunctiva/sclera: Conjunctivae normal.     Pupils: Pupils are equal, round, and reactive to light.  Cardiovascular:     Rate and Rhythm: Normal rate and regular rhythm.     Pulses: Normal pulses.     Heart sounds: Normal heart sounds. No murmur heard.   No friction rub. No gallop.  Pulmonary:     Effort: Pulmonary effort is normal. No respiratory distress.     Breath sounds: Normal breath sounds. No stridor. No wheezing, rhonchi or rales.  Chest:     Chest wall: No  tenderness.  Abdominal:     General: Abdomen is flat. Bowel sounds are normal. There is no distension.     Palpations: Abdomen is soft. There is no mass.     Tenderness: There is no abdominal tenderness. There is no right CVA tenderness, left CVA tenderness, guarding or rebound.     Hernia: No hernia is present.  Musculoskeletal:        General: Normal range of motion.     Cervical back: Normal range of motion and neck supple.  Skin:    General: Skin is warm and dry.     Capillary Refill:  Capillary refill takes less than 2 seconds.     Coloration: Skin is not jaundiced or pale.     Findings: No bruising, erythema, lesion or rash.  Neurological:     General: No focal deficit present.     Mental Status: He is alert and oriented to person, place, and time. Mental status is at baseline.  Psychiatric:        Mood and Affect: Mood normal.        Behavior: Behavior normal.        Thought Content: Thought content normal.        Judgment: Judgment normal.    Results for orders placed or performed during the hospital encounter of 08/20/18  CBC with Differential/Platelet  Result Value Ref Range   WBC 9.0 4.0 - 10.5 K/uL   RBC 4.34 4.22 - 5.81 MIL/uL   Hemoglobin 12.9 (L) 13.0 - 17.0 g/dL   HCT 38.7 (L) 39.0 - 52.0 %   MCV 89.2 80.0 - 100.0 fL   MCH 29.7 26.0 - 34.0 pg   MCHC 33.3 30.0 - 36.0 g/dL   RDW 12.7 11.5 - 15.5 %   Platelets 206 150 - 400 K/uL   nRBC 0.0 0.0 - 0.2 %   Neutrophils Relative % 80 %   Neutro Abs 7.1 1.7 - 7.7 K/uL   Lymphocytes Relative 11 %   Lymphs Abs 1.0 0.7 - 4.0 K/uL   Monocytes Relative 9 %   Monocytes Absolute 0.9 0.1 - 1.0 K/uL   Eosinophils Relative 0 %   Eosinophils Absolute 0.0 0.0 - 0.5 K/uL   Basophils Relative 0 %   Basophils Absolute 0.0 0.0 - 0.1 K/uL   Immature Granulocytes 0 %   Abs Immature Granulocytes 0.03 0.00 - 0.07 K/uL  Basic metabolic panel  Result Value Ref Range   Sodium 139 135 - 145 mmol/L   Potassium 3.9 3.5 - 5.1 mmol/L    Chloride 106 98 - 111 mmol/L   CO2 25 22 - 32 mmol/L   Glucose, Bld 139 (H) 70 - 99 mg/dL   BUN 18 8 - 23 mg/dL   Creatinine, Ser 1.01 0.61 - 1.24 mg/dL   Calcium 8.5 (L) 8.9 - 10.3 mg/dL   GFR calc non Af Amer >60 >60 mL/min   GFR calc Af Amer >60 >60 mL/min   Anion gap 8 5 - 15  ABO/Rh  Result Value Ref Range   ABO/RH(D)      A POS Performed at [facility], [address] [place], [place] [address]   Surgical pathology  Result Value Ref Range   SURGICAL PATHOLOGY      Surgical Pathology CASE: [id number] PATIENT: Pat Kocher Surgical Pathology Report     SPECIMEN SUBMITTED: A. Humeral head, right  CLINICAL HISTORY: None provided  PRE-OPERATIVE DIAGNOSIS: Nontraumatic complete tear of right rotator cuff  POST-OPERATIVE DIAGNOSIS: Same as pre op     DIAGNOSIS: A. HUMERAL HEAD, RIGHT; ARTHROPLASTY: - VIABLE BONE WITH FOCAL DISRUPTION AND ASSOCIATED HEMORRHAGE, COMPATIBLE WITH HISTORY OF TRAUMATIC INJURY. - DEGENERATIVE JOINT DISEASE. - NEGATIVE FOR MALIGNANCY.   GROSS DESCRIPTION: A. Labeled: Right humeral head Received: Formalin Size of specimen:      Head -5.0 x 4.5 x 2.0 cm      Additional tissue: 5.0 x 4.7 x 0.4 cm (roughly circular segment of bone with a dark brown-red hemorrhagic surface, this is inked black and at the opposite green) Articular surface: Tan-brown and smooth Cut surface: Reveals a  tan-white articular cartilage and tan-yellow underlying bone.   Block summary: A1.  Humeral head and s eparately received circular segment  Tissue decalcification: A1   Final Diagnosis performed by Allena Napoleon, MD.   Electronically signed 08/22/2018 1:57:03PM The electronic signature indicates that the named Attending Pathologist has evaluated the specimen  Technical component performed at [facility], [address] [place], [place] [address] Lab: [phone] Dir: Rush Farmer, MD, MMM  Professional component performed at [facility],  [facility], [place], [place], [place] [address] Lab: [phone] Dir: Dellia Nims. Reuel Derby, MD       Assessment & Plan:   Problem List Items Addressed This Visit       Other   History of prostate cancer    S/p prostatectomy in 2006. Will check labs today. Continue to monitor. Call with any concerns. Continue to monitor.       Relevant Orders   Comprehensive metabolic panel   PSA   Other Visit Diagnoses     Bilateral impacted cerumen    -  Primary   Ears flushed today with good results.    History of GI bleed       Labs drawn today. Await results. Treat as needed.    Relevant Orders   Comprehensive metabolic panel   CBC with Differential/Platelet   TSH   History of tobacco use       Labs drawn today. Await results. Treat as needed.    Relevant Orders   Comprehensive metabolic panel   Urinalysis, Routine w reflex microscopic   Screening for cardiovascular condition       Labs drawn today. Await results. Treat as needed.    Relevant Orders   Comprehensive metabolic panel   Lipid Panel w/o Chol/HDL Ratio        Follow up plan: Return in about 1 year (around 02/28/2022), or physical.

## 2021-03-01 LAB — CBC WITH DIFFERENTIAL/PLATELET
Basophils Absolute: 0.1 10*3/uL (ref 0.0–0.2)
Basos: 1 %
EOS (ABSOLUTE): 0.2 10*3/uL (ref 0.0–0.4)
Eos: 3 %
Hematocrit: 45.9 % (ref 37.5–51.0)
Hemoglobin: 15.1 g/dL (ref 13.0–17.7)
Immature Grans (Abs): 0 10*3/uL (ref 0.0–0.1)
Immature Granulocytes: 0 %
Lymphocytes Absolute: 1.3 10*3/uL (ref 0.7–3.1)
Lymphs: 26 %
MCH: 29.4 pg (ref 26.6–33.0)
MCHC: 32.9 g/dL (ref 31.5–35.7)
MCV: 89 fL (ref 79–97)
Monocytes Absolute: 0.5 10*3/uL (ref 0.1–0.9)
Monocytes: 10 %
Neutrophils Absolute: 3.1 10*3/uL (ref 1.4–7.0)
Neutrophils: 60 %
Platelets: 209 10*3/uL (ref 150–450)
RBC: 5.14 x10E6/uL (ref 4.14–5.80)
RDW: 13.1 % (ref 11.6–15.4)
WBC: 5.1 10*3/uL (ref 3.4–10.8)

## 2021-03-01 LAB — COMPREHENSIVE METABOLIC PANEL
ALT: 16 IU/L (ref 0–44)
AST: 19 IU/L (ref 0–40)
Albumin/Globulin Ratio: 1.7 (ref 1.2–2.2)
Albumin: 4.2 g/dL (ref 3.6–4.6)
Alkaline Phosphatase: 63 IU/L (ref 44–121)
BUN/Creatinine Ratio: 13 (ref 10–24)
BUN: 18 mg/dL (ref 8–27)
Bilirubin Total: 0.5 mg/dL (ref 0.0–1.2)
CO2: 23 mmol/L (ref 20–29)
Calcium: 9.4 mg/dL (ref 8.6–10.2)
Chloride: 102 mmol/L (ref 96–106)
Creatinine, Ser: 1.34 mg/dL — ABNORMAL HIGH (ref 0.76–1.27)
Globulin, Total: 2.5 g/dL (ref 1.5–4.5)
Glucose: 79 mg/dL (ref 70–99)
Potassium: 4.1 mmol/L (ref 3.5–5.2)
Sodium: 141 mmol/L (ref 134–144)
Total Protein: 6.7 g/dL (ref 6.0–8.5)
eGFR: 53 mL/min/{1.73_m2} — ABNORMAL LOW (ref >59)

## 2021-03-01 LAB — LIPID PANEL W/O CHOL/HDL RATIO
Cholesterol, Total: 223 mg/dL — ABNORMAL HIGH (ref 100–199)
HDL: 37 mg/dL — ABNORMAL LOW (ref >39)
LDL Chol Calc (NIH): 149 mg/dL — ABNORMAL HIGH (ref 0–99)
Triglycerides: 200 mg/dL — ABNORMAL HIGH (ref 0–149)
VLDL Cholesterol Cal: 37 mg/dL (ref 5–40)

## 2021-03-01 LAB — TSH: TSH: 3.79 u[IU]/mL (ref 0.450–4.500)

## 2021-03-01 LAB — PSA: Prostate Specific Ag, Serum: <0.1 ng/mL (ref 0.0–4.0)

## 2021-03-04 ENCOUNTER — Telehealth: Payer: Self-pay

## 2021-03-04 ENCOUNTER — Other Ambulatory Visit: Payer: Self-pay | Admitting: Family Medicine

## 2021-03-04 DIAGNOSIS — N289 Disorder of kidney and ureter, unspecified: Secondary | ICD-10-CM

## 2021-03-04 DIAGNOSIS — E785 Hyperlipidemia, unspecified: Secondary | ICD-10-CM | POA: Insufficient documentation

## 2021-03-04 DIAGNOSIS — E782 Mixed hyperlipidemia: Secondary | ICD-10-CM

## 2021-03-04 NOTE — Telephone Encounter (Signed)
Informed patient of results and recommendations.  He says he will call back to schedule follow up.

## 2021-03-04 NOTE — Telephone Encounter (Signed)
-----   Message from Valerie Roys, DO sent at 03/04/2021  3:19 PM EDT ----- Please let him know that his labs look pretty good. His cholesterol is borderline, but his kidney function is up a bit- so I'd like him to drink a bunch of water and come back to have it rechecked in about 2-4 weeks (order in, lab only visit OK)

## 2021-05-13 ENCOUNTER — Telehealth: Payer: Self-pay | Admitting: Family Medicine

## 2021-05-13 NOTE — Telephone Encounter (Signed)
Copied from [place] [id number]. Topic: Medicare AWV >> May 13, 2021  2:33 PM Lavonia Drafts wrote: Reason for CRM:  Left message for patient to call back and schedule Medicare Annual Wellness Visit (AWV) to be done virtually or by telephone.  No hx of AWV eligible as of 06/05/09  Please schedule at anytime with [facility]-Nurse Health Advisor.      5 Minutes appointment   Any questions, please call me at [phone]

## 2021-05-19 ENCOUNTER — Ambulatory Visit (INDEPENDENT_AMBULATORY_CARE_PROVIDER_SITE_OTHER): Payer: PPO | Admitting: *Deleted

## 2021-05-19 DIAGNOSIS — Z0000 Encounter for general adult medical examination without abnormal findings: Secondary | ICD-10-CM

## 2021-05-19 NOTE — Patient Instructions (Signed)
Michael Stone , Thank you for taking time to come for your Medicare Wellness Visit. I appreciate your ongoing commitment to your health goals. Please review the following plan we discussed and let me know if I can assist you in the future.   Screening recommendations/referrals: Colonoscopy: no longer indicated Recommended yearly ophthalmology/optometry visit for glaucoma screening and checkup Recommended yearly dental visit for hygiene and checkup  Vaccinations: Influenza vaccine: Education provided Pneumococcal vaccine: Education provided Tdap vaccine: up to date Shingles vaccine: Education provided    Advanced directives: Education provided  Conditions/risks identified:   Next appointment:  Preventive Care 83 Years and Older, Male Preventive care refers to lifestyle choices and visits with your health care provider that can promote health and wellness. What does preventive care include? A yearly physical exam. This is also called an annual well check. Dental exams once or twice a year. Routine eye exams. Ask your health care provider how often you should have your eyes checked. Personal lifestyle choices, including: Daily care of your teeth and gums. Regular physical activity. Eating a healthy diet. Avoiding tobacco and drug use. Limiting alcohol use. Practicing safe sex. Taking low doses of aspirin every day. Taking vitamin and mineral supplements as recommended by your health care provider. What happens during an annual well check? The services and screenings done by your health care provider during your annual well check will depend on your age, overall health, lifestyle risk factors, and family history of disease. Counseling  Your health care provider may ask you questions about your: Alcohol use. Tobacco use. Drug use. Emotional well-being. Home and relationship well-being. Sexual activity. Eating habits. History of falls. Memory and ability to understand  (cognition). Work and work Statistician. Screening  You may have the following tests or measurements: Height, weight, and BMI. Blood pressure. Lipid and cholesterol levels. These may be checked every 5 years, or more frequently if you are over 83 years old. Skin check. Lung cancer screening. You may have this screening every year starting at age 83 if you have a 30-pack-year history of smoking and currently smoke or have quit within the past 15 years. Fecal occult blood test (FOBT) of the stool. You may have this test every year starting at age 83. Flexible sigmoidoscopy or colonoscopy. You may have a sigmoidoscopy every 5 years or a colonoscopy every 10 years starting at age 83. Prostate cancer screening. Recommendations will vary depending on your family history and other risks. Hepatitis C blood test. Hepatitis B blood test. Sexually transmitted disease (STD) testing. Diabetes screening. This is done by checking your blood sugar (glucose) after you have not eaten for a while (fasting). You may have this done every 1-3 years. Abdominal aortic aneurysm (AAA) screening. You may need this if you are a current or former smoker. Osteoporosis. You may be screened starting at age 83 if you are at high risk. Talk with your health care provider about your test results, treatment options, and if necessary, the need for more tests. Vaccines  Your health care provider may recommend certain vaccines, such as: Influenza vaccine. This is recommended every year. Tetanus, diphtheria, and acellular pertussis (Tdap, Td) vaccine. You may need a Td booster every 10 years. Zoster vaccine. You may need this after age 83. Pneumococcal 13-valent conjugate (PCV13) vaccine. One dose is recommended after age 83. Pneumococcal polysaccharide (PPSV23) vaccine. One dose is recommended after age 83. Talk to your health care provider about which screenings and vaccines you need and how often you  need them. This  information is not intended to replace advice given to you by your health care provider. Make sure you discuss any questions you have with your health care provider. Document Released: 06/18/2015 Document Revised: 02/09/2016 Document Reviewed: 03/23/2015 Elsevier Interactive Patient Education  2017 [place] Prevention in the Home Falls can cause injuries. They can happen to people of all ages. There are many things you can do to make your home safe and to help prevent falls. What can I do on the outside of my home? Regularly fix the edges of walkways and driveways and fix any cracks. Remove anything that might make you trip as you walk through a door, such as a raised step or threshold. Trim any bushes or trees on the path to your home. Use bright outdoor lighting. Clear any walking paths of anything that might make someone trip, such as rocks or tools. Regularly check to see if handrails are loose or broken. Make sure that both sides of any steps have handrails. Any raised decks and porches should have guardrails on the edges. Have any leaves, snow, or ice cleared regularly. Use sand or salt on walking paths during winter. Clean up any spills in your garage right away. This includes oil or grease spills. What can I do in the bathroom? Use night lights. Install grab bars by the toilet and in the tub and shower. Do not use towel bars as grab bars. Use non-skid mats or decals in the tub or shower. If you need to sit down in the shower, use a plastic, non-slip stool. Keep the floor dry. Clean up any water that spills on the floor as soon as it happens. Remove soap buildup in the tub or shower regularly. Attach bath mats securely with double-sided non-slip rug tape. Do not have throw rugs and other things on the floor that can make you trip. What can I do in the bedroom? Use night lights. Make sure that you have a light by your bed that is easy to reach. Do not use any sheets or  blankets that are too big for your bed. They should not hang down onto the floor. Have a firm chair that has side arms. You can use this for support while you get dressed. Do not have throw rugs and other things on the floor that can make you trip. What can I do in the kitchen? Clean up any spills right away. Avoid walking on wet floors. Keep items that you use a lot in easy-to-reach places. If you need to reach something above you, use a strong step stool that has a grab bar. Keep electrical cords out of the way. Do not use floor polish or wax that makes floors slippery. If you must use wax, use non-skid floor wax. Do not have throw rugs and other things on the floor that can make you trip. What can I do with my stairs? Do not leave any items on the stairs. Make sure that there are handrails on both sides of the stairs and use them. Fix handrails that are broken or loose. Make sure that handrails are as long as the stairways. Check any carpeting to make sure that it is firmly attached to the stairs. Fix any carpet that is loose or worn. Avoid having throw rugs at the top or bottom of the stairs. If you do have throw rugs, attach them to the floor with carpet tape. Make sure that you have a light switch  at the top of the stairs and the bottom of the stairs. If you do not have them, ask someone to add them for you. What else can I do to help prevent falls? Wear shoes that: Do not have high heels. Have rubber bottoms. Are comfortable and fit you well. Are closed at the toe. Do not wear sandals. If you use a stepladder: Make sure that it is fully opened. Do not climb a closed stepladder. Make sure that both sides of the stepladder are locked into place. Ask someone to hold it for you, if possible. Clearly mark and make sure that you can see: Any grab bars or handrails. First and last steps. Where the edge of each step is. Use tools that help you move around (mobility aids) if they are  needed. These include: Canes. Walkers. Scooters. Crutches. Turn on the lights when you go into a dark area. Replace any light bulbs as soon as they burn out. Set up your furniture so you have a clear path. Avoid moving your furniture around. If any of your floors are uneven, fix them. If there are any pets around you, be aware of where they are. Review your medicines with your doctor. Some medicines can make you feel dizzy. This can increase your chance of falling. Ask your doctor what other things that you can do to help prevent falls. This information is not intended to replace advice given to you by your health care provider. Make sure you discuss any questions you have with your health care provider. Document Released: 03/18/2009 Document Revised: 10/28/2015 Document Reviewed: 06/26/2014 Elsevier Interactive Patient Education  2017 [employer].

## 2021-05-19 NOTE — Progress Notes (Signed)
Subjective:   Michael Stone. is a 85 y.o. male who presents for Medicare Annual/Subsequent preventive   I connected with  Tadeo Besecker. on 05/19/21 by a telephone enabled telemedicine application and verified that I am speaking with the correct person using two identifiers.   I discussed the limitations of evaluation and management by telemedicine. The patient expressed understanding and agreed to proceed.  Patient location: home  Provider location: Tele-health not in office   Review of Systems           Objective:    There were no vitals filed for this visit. There is no height or weight on file to calculate BMI.  Advanced Directives 08/20/2018 08/20/2018 08/13/2018 03/19/2013 03/17/2013  Does Patient Have a Medical Advance Directive? Yes Yes Yes Patient has advance directive, copy not in chart Patient has advance directive, copy not in chart  Type of Advance Directive Living will;Healthcare Power of Attorney Living will Living will [place];Living will [place];Living will  Does patient want to make changes to medical advance directive? No - Patient declined No - Patient declined No - Patient declined - -  Copy of [place] in Chart? No - copy requested - - Copy requested from family -  Pre-existing out of facility DNR order (yellow form or pink MOST form) - - - - No    Current Medications (verified) Outpatient Encounter Medications as of 05/19/2021  Medication Sig   aspirin EC 325 MG tablet Take 1 tablet (325 mg total) by mouth daily.   naproxen sodium (ALEVE) 220 MG tablet Take 220 mg by mouth daily as needed (pain).   No facility-administered encounter medications on file as of 05/19/2021.    Allergies (verified) Patient has no known allergies.   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   Past Surgical History:  Procedure Laterality Date   ANTERIOR CERVICAL DECOMP/DISCECTOMY FUSION  03/19/2013   ANTERIOR CERVICAL DECOMPRESSION/DISCECTOMY FUSION 4 LEVELS N/A 03/19/2013   Procedure: Cervical Three-Four, Cervical Four-Five, Cervical Five-Six, Cervical Six-Seven anteiror cervical decompression with fusion with plating and bonegraft;  Surgeon: Floyce Stakes, MD;  Location: [facility];  Service: Neurosurgery;  Laterality: N/A;  ANTERIOR CERVICAL DECOMPRESSION/DISCECTOMY FUSION 4 LEVELS   BACK SURGERY     LUMBAR DISC SURGERY  ~ 2010   "ruptured disc" (03/19/2013)   PROSTATECTOMY  2006   REVERSE SHOULDER ARTHROPLASTY Right 08/20/2018   Procedure: REVERSE RIGHT TOTAL SHOULDER ARTHROPLASTY;  Surgeon: Corky Mull, MD;  Location: [facility];  Service: Orthopedics;  Laterality: Right;   No family history on file. Social History   Socioeconomic History   Marital status: Married    Spouse name: Barnett Applebaum   Number of children: Not on file   Years of education: Not on file   Highest education level: Not on file  Occupational History   Occupation: still working!!!  Tobacco Use   Smoking status: Former    Types: Pipe    Quit date: 03/18/1983    Years since quitting: 38.1   Smokeless tobacco: Never  Vaping Use   Vaping Use: Never used  Substance and Sexual Activity   Alcohol use: No   Drug use: No   Sexual activity: Never  Other Topics Concern   Not on file  Social History Narrative   Not on file   Social Determinants of Health   Financial Resource Strain: Not on file  Food Insecurity: Not on file  Transportation Needs: Not on file  Physical Activity: Not on file  Stress: Not on file  Social Connections: Not on file    Tobacco Counseling Counseling given: Not  Answered   Clinical Intake:                 Diabetic?  no         Activities of Daily Living No flowsheet data found.  Patient Care Team: Valerie Roys, DO as PCP - General (Family Medicine)  Indicate any recent Medical Services you may have received from other than [facility] providers in the past year (date may be approximate).     Assessment:   This is a routine wellness examination for [place].  Hearing/Vision screen No results found.  Dietary issues and exercise activities discussed:     Goals Addressed   None   Depression Screen No flowsheet data found.  Fall Risk Fall Risk  01/02/2019  Falls in the past year? (No Data)  Comment Emmi Telephone Survey: data to providers prior to load  Number falls in past yr: (No Data)  Comment Emmi Telephone Survey Actual Response =     FALL RISK PREVENTION PERTAINING TO THE HOME:  Any stairs in or around the home? No  If so, are there any without handrails? No  Home free of loose throw rugs in walkways, pet beds, electrical cords, etc? Yes  Adequate lighting in your home to reduce risk of falls? Yes   ASSISTIVE DEVICES UTILIZED TO PREVENT FALLS:  Life alert? No  Use of a cane, walker or w/c? No  Grab bars in the bathroom? Yes  Shower chair or bench in shower? No  Elevated toilet seat or a handicapped toilet? No   TIMED UP AND GO:  Was the test performed? No .    Cognitive Function:  Normal cognitive status assessed by direct observation by this Nurse Health Advisor. No abnormalities found.          Immunizations Immunization History  Administered Date(s) Administered   [employer] Vaccination 08/27/2020, 09/17/2020   Td 06/06/2015    TDAP status: Up to date  Flu Vaccine status: Declined, Education has been provided regarding the importance of this vaccine but patient still declined. Advised may receive this vaccine at local pharmacy or Health Dept. Aware to provide a copy of the  vaccination record if obtained from local pharmacy or Health Dept. Verbalized acceptance and understanding.  Pneumococcal vaccine status: Due, Education has been provided regarding the importance of this vaccine. Advised may receive this vaccine at local pharmacy or Health Dept. Aware to provide a copy of the vaccination record if obtained from local pharmacy or Health Dept. Verbalized acceptance and understanding.  Covid-19 vaccine status: Information provided on how to obtain vaccines.   Qualifies for Shingles Vaccine? No   Zostavax completed No   Shingrix Completed?: No.    Education has been provided regarding the importance of this vaccine. Patient has been advised to call insurance company to determine out of pocket expense if they have not yet received this vaccine. Advised may also receive vaccine at local pharmacy or Health Dept. Verbalized acceptance and understanding.  Screening Tests Health Maintenance  Topic Date Due   Pneumonia Vaccine 3+ Years old (1 - PCV) Never done   URINE MICROALBUMIN  Never done   COVID-19 Vaccine (2 - Moderna series) 10/15/2020   Zoster Vaccines- Shingrix (1 of 2) 05/30/2021 (Originally [birth year]1987)   INFLUENZA VACCINE  09/02/2021 (Originally 01/03/2021)   TETANUS/TDAP  06/05/2025   HPV VACCINES  Aged Out    Health Maintenance  Health Maintenance Due  Topic Date Due   Pneumonia Vaccine 65+ Years old (1 - PCV) Never done   URINE MICROALBUMIN  Never done   COVID-19 Vaccine (2 - Moderna series) 10/15/2020    Colorectal cancer screening: No longer required.   Lung Cancer Screening: (Low Dose CT Chest recommended if Age 38-80 years, 30 pack-year currently smoking OR have quit w/in 15years.) qualify.   Lung Cancer Screening Referral:   Additional Screening:  Hepatitis C Screening: does not qualify; Completed   Vision Screening: Recommended annual ophthalmology exams for early detection of glaucoma and other disorders of the eye. Is the patient up  to date with their annual eye exam?  No  Who is the provider or what is the name of the office in which the patient attends annual eye exams?  If pt is not established with a provider, would they like to be referred to a provider to establish care? No .   Dental Screening: Recommended annual dental exams for proper oral hygiene  Community Resource Referral / Chronic Care Management: CRR required this visit?  No   CCM required this visit?  No      Plan:     I have personally reviewed and noted the following in the patients chart:   Medical and social history Use of alcohol, tobacco or illicit drugs  Current medications and supplements including opioid prescriptions. Patient is not currently taking opioid prescriptions. Functional ability and status Nutritional status Physical activity Advanced directives List of other physicians Hospitalizations, surgeries, and ER visits in previous 12 months Vitals Screenings to include cognitive, depression, and falls Referrals and appointments  In addition, I have reviewed and discussed with patient certain preventive protocols, quality metrics, and best practice recommendations. A written personalized care plan for preventive services as well as general preventive health recommendations were provided to patient.     Leroy Kennedy, LPN   [id number]  Nurse Notes:

## 2021-08-11 ENCOUNTER — Other Ambulatory Visit: Payer: Self-pay

## 2021-08-11 ENCOUNTER — Ambulatory Visit: Payer: PPO | Admitting: Dermatology

## 2021-09-15 ENCOUNTER — Ambulatory Visit: Payer: PPO | Admitting: Family Medicine

## 2021-10-05 ENCOUNTER — Ambulatory Visit: Payer: PPO | Admitting: Dermatology

## 2021-10-05 DIAGNOSIS — L820 Inflamed seborrheic keratosis: Secondary | ICD-10-CM

## 2021-10-05 DIAGNOSIS — L578 Other skin changes due to chronic exposure to nonionizing radiation: Secondary | ICD-10-CM | POA: Diagnosis not present

## 2021-10-05 DIAGNOSIS — L570 Actinic keratosis: Secondary | ICD-10-CM | POA: Diagnosis not present

## 2021-10-05 DIAGNOSIS — D492 Neoplasm of unspecified behavior of bone, soft tissue, and skin: Secondary | ICD-10-CM

## 2021-10-05 MED ORDER — MUPIROCIN 2 % EX OINT: TOPICAL_OINTMENT | CUTANEOUS | 2 refills | Status: AC

## 2021-10-05 NOTE — Progress Notes (Signed)
? ?New Patient Visit ? ?Subjective  ?Michael Stone. is a 86 y.o. male who presents for the following: Skin Problem (The patient has spots, moles and lesions to be evaluated, some may be new or changing and the patient has concerns that these could be cancer. ). ? ?The following portions of the chart were reviewed this encounter and updated as appropriate:  ? Tobacco  Allergies  Meds  Problems  Med Hx  Surg Hx  Fam Hx   ?  ?Review of Systems:  No other skin or systemic complaints except as noted in HPI or Assessment and Plan. ? ?Objective  ?Well appearing patient in no apparent distress; mood and affect are within normal limits. ? ?A focused examination was performed including neck . Relevant physical exam findings are noted in the Assessment and Plan. ? ?neck 19 (19) ?Stuck-on, waxy, tan-brown papules -- Discussed benign etiology and prognosis.  ? ?right ear x 2, right cheek x 3  (5) (5) ?Erythematous thin papules/macules with gritty scale.  ? ?Neck - Posterior ?Crusty patch ? ? ?Assessment & Plan  ?Inflamed seborrheic keratosis (19) ?neck 19 ? ?Irritated.  Patient would like treated. ? ?Destruction of lesion - neck 19 ?Complexity: simple   ?Destruction method: cryotherapy   ?Informed consent: discussed and consent obtained   ?Timeout:  patient name, date of birth, surgical site, and procedure verified ?Lesion destroyed using liquid nitrogen: Yes   ?Region frozen until ice ball extended beyond lesion: Yes   ?Outcome: patient tolerated procedure well with no complications   ?Post-procedure details: wound care instructions given   ? ?AK (actinic keratosis) (5) ?right ear x 2, right cheek x 3  (5) ? ?Actinic keratoses are precancerous spots that appear secondary to cumulative UV radiation exposure/sun exposure over time. They are chronic with expected duration over 1 year. A portion of actinic keratoses will progress to squamous cell carcinoma of the skin. It is not possible to reliably predict which  spots will progress to skin cancer and so treatment is recommended to prevent development of skin cancer. ? ?Recommend daily broad spectrum sunscreen SPF 30+ to sun-exposed areas, reapply every 2 hours as needed.  ?Recommend staying in the shade or wearing long sleeves, sun glasses (UVA+UVB protection) and wide brim hats (4-inch brim around the entire circumference of the hat). ?Call for new or changing lesions.  ? ?Destruction of lesion - right ear x 2, right cheek x 3  (5) ?Complexity: simple   ?Destruction method: cryotherapy   ?Informed consent: discussed and consent obtained   ?Timeout:  patient name, date of birth, surgical site, and procedure verified ?Lesion destroyed using liquid nitrogen: Yes   ?Region frozen until ice ball extended beyond lesion: Yes   ?Outcome: patient tolerated procedure well with no complications   ?Post-procedure details: wound care instructions given   ? ?Neoplasm of skin -crust ?Neck - Posterior ?Start Mupirocin ointment apply to crusty patch daily  ? ?Recheck at next office visit may consider biopsy if persistent. ? ?Related Medications ?mupirocin ointment (BACTROBAN) 2 % ?Apply to crusty areas on neck qd-bid ? ?Actinic Damage ?- chronic, secondary to cumulative UV radiation exposure/sun exposure over time ?- diffuse scaly erythematous macules with underlying dyspigmentation ?- Recommend daily broad spectrum sunscreen SPF 30+ to sun-exposed areas, reapply every 2 hours as needed.  ?- Recommend staying in the shade or wearing long sleeves, sun glasses (UVA+UVB protection) and wide brim hats (4-inch brim around the entire circumference of the hat). ?- Call  for new or changing lesions. ? ?Return in about 3 months (around 01/05/2022) for Aks, ISKs. ? ?I, Marye Round, CMA, am acting as scribe for Sarina Ser, MD .  ?Documentation: I have reviewed the above documentation for accuracy and completeness, and I agree with the above. ? ?Sarina Ser, MD ? ?

## 2021-10-05 NOTE — Patient Instructions (Signed)

## 2021-10-18 ENCOUNTER — Encounter: Payer: Self-pay | Admitting: Dermatology

## 2021-11-16 ENCOUNTER — Ambulatory Visit (INDEPENDENT_AMBULATORY_CARE_PROVIDER_SITE_OTHER): Payer: PPO | Admitting: Family Medicine

## 2021-11-16 ENCOUNTER — Encounter: Payer: Self-pay | Admitting: Family Medicine

## 2021-11-16 VITALS — BP 143/66 | HR 59 | Temp 98.1°F | Wt 159.4 lb

## 2021-11-16 DIAGNOSIS — J069 Acute upper respiratory infection, unspecified: Secondary | ICD-10-CM

## 2021-11-16 MED ORDER — PREDNISONE 50 MG PO TABS: 50.0000 mg | ORAL_TABLET | Freq: Every day | ORAL | 0 refills | Status: AC

## 2021-11-16 NOTE — Progress Notes (Signed)
BP (!) 143/66   Pulse (!) 59   Temp 98.1 F (36.7 C)   Wt 159 lb 6.4 oz (72.3 kg)   SpO2 96%   BMI 22.23 kg/m    Subjective:    Patient ID: Michael Girt., male    DOB: 05/15/1936, 86 y.o.   MRN: [id number]  HPI: Michael Flori. is a 86 y.o. male  Chief Complaint  Patient presents with   URI    Patient states he has been coughing up green mucus for a few days. Patient states his wife was sick first and passed it on to him   UPPER RESPIRATORY TRACT INFECTION Duration: 2-3 days Worst symptom: cough Fever: no Cough: yes Shortness of breath: no Wheezing: no Chest pain: no Chest tightness: no Chest congestion: no Nasal congestion: no Runny nose: no Post nasal drip: yes Sneezing: no Sore throat: no Swollen glands: no Sinus pressure: yes Headache: yes Face pain: no Toothache: no Ear pain: no  Ear pressure: no  Eyes red/itching:no Eye drainage/crusting: no  Vomiting: no Rash: no Fatigue: yes Sick contacts: yes Strep contacts: no  Context: worse Recurrent sinusitis: no Relief with OTC cold/cough medications: no  Treatments attempted: none   Relevant past medical, surgical, family and social history reviewed and updated as indicated. Interim medical history since our last visit reviewed. Allergies and medications reviewed and updated.  Review of Systems  Constitutional: Negative.   HENT:  Positive for rhinorrhea. Negative for congestion, dental problem, drooling, ear discharge, ear pain, facial swelling, hearing loss, mouth sores, nosebleeds, postnasal drip, sinus pressure, sinus pain, sneezing, sore throat, tinnitus, trouble swallowing and voice change.   Respiratory:  Positive for cough. Negative for apnea, choking, chest tightness, shortness of breath, wheezing and stridor.   Cardiovascular: Negative.   Gastrointestinal: Negative.   Musculoskeletal: Negative.   Neurological: Negative.   Psychiatric/Behavioral: Negative.      Per HPI  unless specifically indicated above     Objective:    BP (!) 143/66   Pulse (!) 59   Temp 98.1 F (36.7 C)   Wt 159 lb 6.4 oz (72.3 kg)   SpO2 96%   BMI 22.23 kg/m   Wt Readings from Last 3 Encounters:  11/16/21 159 lb 6.4 oz (72.3 kg)  02/28/21 163 lb (73.9 kg)  08/20/18 156 lb 1 oz (70.8 kg)    Physical Exam Vitals and nursing note reviewed.  Constitutional:      General: He is not in acute distress.    Appearance: Normal appearance. He is normal weight. He is not ill-appearing, toxic-appearing or diaphoretic.  HENT:     Head: Normocephalic and atraumatic.     Right Ear: Tympanic membrane, ear canal and external ear normal.     Left Ear: Tympanic membrane, ear canal and external ear normal.     Nose: Rhinorrhea present. No congestion.     Mouth/Throat:     Mouth: Mucous membranes are moist.     Pharynx: Oropharynx is clear. No oropharyngeal exudate or posterior oropharyngeal erythema.  Eyes:     General: No scleral icterus.       Right eye: No discharge.        Left eye: No discharge.     Extraocular Movements: Extraocular movements intact.     Conjunctiva/sclera: Conjunctivae normal.     Pupils: Pupils are equal, round, and reactive to light.  Neck:     Vascular: No carotid bruit.  Cardiovascular:  Rate and Rhythm: Normal rate and regular rhythm.     Pulses: Normal pulses.     Heart sounds: Normal heart sounds. No murmur heard.    No friction rub. No gallop.  Pulmonary:     Effort: Pulmonary effort is normal. No respiratory distress.     Breath sounds: Normal breath sounds. No stridor. No wheezing, rhonchi or rales.  Chest:     Chest wall: No tenderness.  Musculoskeletal:        General: Normal range of motion.     Cervical back: Normal range of motion and neck supple. No rigidity or tenderness.  Lymphadenopathy:     Cervical: No cervical adenopathy.  Skin:    General: Skin is warm and dry.     Capillary Refill: Capillary refill takes less than 2  seconds.     Coloration: Skin is not jaundiced or pale.     Findings: No bruising, erythema, lesion or rash.  Neurological:     General: No focal deficit present.     Mental Status: He is alert and oriented to person, place, and time. Mental status is at baseline.  Psychiatric:        Mood and Affect: Mood normal.        Behavior: Behavior normal.        Thought Content: Thought content normal.        Judgment: Judgment normal.     Results for orders placed or performed in visit on 02/28/21  Microscopic Examination   Urine  Result Value Ref Range   WBC, UA None seen 0 - 5 /hpf   RBC 0-2 0 - 2 /hpf   Epithelial Cells (non renal) 0-10 0 - 10 /hpf   Bacteria, UA None seen None seen/Few  Comprehensive metabolic panel  Result Value Ref Range   Glucose 79 70 - 99 mg/dL   BUN 18 8 - 27 mg/dL   Creatinine, Ser 1.34 (H) 0.76 - 1.27 mg/dL   eGFR 53 (L) >59 mL/min/1.73   BUN/Creatinine Ratio 13 10 - 24   Sodium 141 134 - 144 mmol/L   Potassium 4.1 3.5 - 5.2 mmol/L   Chloride 102 96 - 106 mmol/L   CO2 23 20 - 29 mmol/L   Calcium 9.4 8.6 - 10.2 mg/dL   Total Protein 6.7 6.0 - 8.5 g/dL   Albumin 4.2 3.6 - 4.6 g/dL   Globulin, Total 2.5 1.5 - 4.5 g/dL   Albumin/Globulin Ratio 1.7 1.2 - 2.2   Bilirubin Total 0.5 0.0 - 1.2 mg/dL   Alkaline Phosphatase 63 44 - 121 IU/L   AST 19 0 - 40 IU/L   ALT 16 0 - 44 IU/L  CBC with Differential/Platelet  Result Value Ref Range   WBC 5.1 3.4 - 10.8 x10E3/uL   RBC 5.14 4.14 - 5.80 x10E6/uL   Hemoglobin 15.1 13.0 - 17.7 g/dL   Hematocrit 45.9 37.5 - 51.0 %   MCV 89 79 - 97 fL   MCH 29.4 26.6 - 33.0 pg   MCHC 32.9 31.5 - 35.7 g/dL   RDW 13.1 11.6 - 15.4 %   Platelets 209 150 - 450 x10E3/uL   Neutrophils 60 Not Estab. %   Lymphs 26 Not Estab. %   Monocytes 10 Not Estab. %   Eos 3 Not Estab. %   Basos 1 Not Estab. %   Neutrophils Absolute 3.1 1.4 - 7.0 x10E3/uL   Lymphocytes Absolute 1.3 0.7 - 3.1 x10E3/uL   Monocytes Absolute 0.5  0.1 - 0.9  x10E3/uL   EOS (ABSOLUTE) 0.2 0.0 - 0.4 x10E3/uL   Basophils Absolute 0.1 0.0 - 0.2 x10E3/uL   Immature Granulocytes 0 Not Estab. %   Immature Grans (Abs) 0.0 0.0 - 0.1 x10E3/uL  Lipid Panel w/o Chol/HDL Ratio  Result Value Ref Range   Cholesterol, Total 223 (H) 100 - 199 mg/dL   Triglycerides 200 (H) 0 - 149 mg/dL   HDL 37 (L) >39 mg/dL   VLDL Cholesterol Cal 37 5 - 40 mg/dL   LDL Chol Calc (NIH) 149 (H) 0 - 99 mg/dL  PSA  Result Value Ref Range   Prostate Specific Ag, Serum <0.1 0.0 - 4.0 ng/mL  TSH  Result Value Ref Range   TSH 3.790 0.450 - 4.500 uIU/mL  Urinalysis, Routine w reflex microscopic  Result Value Ref Range   Specific Gravity, UA 1.025 1.005 - 1.030   pH, UA 5.0 5.0 - 7.5   Color, UA Yellow Yellow   Appearance Ur Clear Clear   Leukocytes,UA Negative Negative   Protein,UA Negative Negative/Trace   Glucose, UA Negative Negative   Ketones, UA Negative Negative   RBC, UA Trace (A) Negative   Bilirubin, UA Negative Negative   Urobilinogen, Ur 0.2 0.2 - 1.0 mg/dL   Nitrite, UA Negative Negative   Microscopic Examination See below:       Assessment & Plan:   Problem List Items Addressed This Visit   None Visit Diagnoses     Upper respiratory tract infection, unspecified type    -  Primary   Will treat with burst of prednisone. If not getting better or getting worse, will let us know and we'll call in abx.         Follow up plan: Return if symptoms worsen or fail to improve.

## 2021-11-16 NOTE — Patient Instructions (Signed)
You have a cold which should be getting better in about 2-3 days. The prednisone will open you up and prevent a sinus infection. If you're not getting better by Friday, let me know and I'll call you in an antibiotic

## 2022-01-05 ENCOUNTER — Ambulatory Visit: Payer: PPO | Admitting: Dermatology

## 2022-01-05 DIAGNOSIS — L820 Inflamed seborrheic keratosis: Secondary | ICD-10-CM

## 2022-01-05 DIAGNOSIS — L280 Lichen simplex chronicus: Secondary | ICD-10-CM | POA: Diagnosis not present

## 2022-01-05 DIAGNOSIS — L821 Other seborrheic keratosis: Secondary | ICD-10-CM | POA: Diagnosis not present

## 2022-01-05 DIAGNOSIS — H1032 Unspecified acute conjunctivitis, left eye: Secondary | ICD-10-CM | POA: Diagnosis not present

## 2022-01-05 DIAGNOSIS — L281 Prurigo nodularis: Secondary | ICD-10-CM

## 2022-01-05 DIAGNOSIS — Z872 Personal history of diseases of the skin and subcutaneous tissue: Secondary | ICD-10-CM

## 2022-01-05 MED ORDER — CLOBETASOL PROPIONATE 0.05 % EX CREA: 1.0000 | TOPICAL_CREAM | CUTANEOUS | 0 refills | Status: AC

## 2022-01-05 NOTE — Patient Instructions (Addendum)
For itchy bumps and excoriations on arms and neck Start Mupirocin ointment once daily to open sores at night Start Clobetasol cream in the morning to itchy bumps until clear, then as needed for flares, avoid face, groin, axilla Avoid picking at these bumps Recommend N-acetylcysteine (NAC) 600 mg supplement three times per day to help with picking        Due to recent changes in healthcare laws, you may see results of your pathology and/or laboratory studies on MyChart before the doctors have had a chance to review them. We understand that in some cases there may be results that are confusing or concerning to you. Please understand that not all results are received at the same time and often the doctors may need to interpret multiple results in order to provide you with the best plan of care or course of treatment. Therefore, we ask that you please give [place] 2 business days to thoroughly review all your results before contacting the office for clarification. Should we see a critical lab result, you will be contacted sooner.   If You Need Anything After Your Visit  If you have any questions or concerns for your doctor, please call our main line at [phone] and press option 4 to reach your doctor's medical assistant. If no one answers, please leave a voicemail as directed and we will return your call as soon as possible. Messages left after 4 pm will be answered the following business day.   You may also send [place] a message via [place]. We typically respond to MyChart messages within 1-2 business days.  For prescription refills, please ask your pharmacy to contact our office. Our fax number is [phone].  If you have an urgent issue when the clinic is closed that cannot wait until the next business day, you can page your doctor at the number below.    Please note that while we do our best to be available for urgent issues outside of office hours, we are not available 24/7.   If you have an  urgent issue and are unable to reach [place], you may choose to seek medical care at your doctor's office, retail clinic, urgent care center, or emergency room.  If you have a medical emergency, please immediately call 911 or go to the emergency department.  Pager Numbers  - Dr. Nehemiah Massed: [phone]  - Dr. Laurence Ferrari: [phone]  - Dr. Nicole Kindred: [phone]  In the event of inclement weather, please call our main line at [phone] for an update on the status of any delays or closures.  Dermatology Medication Tips: Please keep the boxes that topical medications come in in order to help keep track of the instructions about where and how to use these. Pharmacies typically print the medication instructions only on the boxes and not directly on the medication tubes.   If your medication is too expensive, please contact our office at [phone] option 4 or send [place] a message through [place].   We are unable to tell what your co-pay for medications will be in advance as this is different depending on your insurance coverage. However, we may be able to find a substitute medication at lower cost or fill out paperwork to get insurance to cover a needed medication.   If a prior authorization is required to get your medication covered by your insurance company, please allow [place] 1-2 business days to complete this process.  Drug prices often vary depending on where the prescription is filled and some pharmacies  may offer cheaper prices.  The website www.goodrx.com contains coupons for medications through different pharmacies. The prices here do not account for what the cost may be with help from insurance (it may be cheaper with your insurance), but the website can give you the price if you did not use any insurance.  - You can print the associated coupon and take it with your prescription to the pharmacy.  - You may also stop by our office during regular business hours and pick up a GoodRx coupon card.  -  If you need your prescription sent electronically to a different pharmacy, notify our office through [facility] or by phone at [phone] option 4.     Si Usted Necesita Algo Despus de Su Visita  Tambin puede enviarnos un mensaje a travs de Pharmacist, community. Por lo general respondemos a los mensajes de MyChart en el transcurso de 1 a 2 das hbiles.  Para renovar recetas, por favor pida a su farmacia que se ponga en contacto con nuestra oficina. Harland Dingwall de fax es [place] [phone].  Si tiene un asunto urgente cuando la clnica est cerrada y que no puede esperar hasta el siguiente da hbil, puede llamar/localizar a su doctor(a) al nmero que aparece a continuacin.   Por favor, tenga en cuenta que aunque hacemos todo lo posible para estar disponibles para asuntos urgentes fuera del horario de [place], no estamos disponibles las 24 horas del da, los 7 das de la [place].   Si tiene un problema urgente y no puede comunicarse con nosotros, puede optar por buscar atencin mdica  en el consultorio de su doctor(a), en una clnica privada, en un centro de atencin urgente o en una sala de emergencias.  Si tiene Engineering geologist, por favor llame inmediatamente al 911 o vaya a la sala de emergencias.  Nmeros de bper  - Dr. Nehemiah Massed: [phone]  - Dra. Moye: [phone]  - Dra. Nicole Kindred: [phone]  En caso de inclemencias del [place], por favor llame a Johnsie Kindred principal al [phone] para una actualizacin sobre el [place] de cualquier retraso o cierre.  Consejos para la medicacin en dermatologa: Por favor, guarde las cajas en las que vienen los medicamentos de uso tpico para ayudarle a seguir las instrucciones sobre dnde y cmo usarlos. Las farmacias generalmente imprimen las instrucciones del medicamento slo en las cajas y no directamente en los tubos del [place].   Si su medicamento es muy caro, por favor, pngase en contacto con Zigmund Daniel  llamando al [phone] y presione la opcin 4 o envenos un mensaje a travs de Pharmacist, community.   No podemos decirle cul ser su copago por los medicamentos por adelantado ya que esto es diferente dependiendo de la cobertura de su seguro. Sin embargo, es posible que podamos encontrar un medicamento sustituto a Electrical engineer un formulario para que el seguro cubra el medicamento que se considera necesario.   Si se requiere una autorizacin previa para que su compaa de seguros [place] su medicamento, por favor permtanos de 1 a 2 das hbiles para completar este proceso.  Los precios de los medicamentos varan con frecuencia dependiendo del Environmental consultant de dnde se surte la receta y alguna farmacias pueden ofrecer precios ms baratos.  El sitio web www.goodrx.com tiene cupones para medicamentos de Airline pilot. Los precios aqu no tienen en cuenta lo que podra costar con la ayuda del seguro (puede ser ms barato con su seguro), pero el sitio web puede darle el precio si no  utiliz ningn seguro.  - Puede imprimir el cupn correspondiente y llevarlo con su receta a la farmacia.  - Tambin puede pasar por nuestra oficina durante el horario de atencin regular y recoger una tarjeta de cupones de GoodRx.  - Si necesita que su receta se enve electrnicamente a una farmacia diferente, informe a nuestra oficina a travs de MyChart de [place] o por telfono llamando al [phone] y presione la opcin 4.  

## 2022-01-05 NOTE — Progress Notes (Signed)
Follow-Up Visit   Subjective  Michael Stone Michael Stone. is a 86 y.o. male who presents for the following: Actinic Keratosis (Face, ears, 47mf/u) and ISK f/u (Neck, 317m/u). The patient has spots, moles and lesions to be evaluated, some may be new or changing and the patient has concerns that these could be cancer.  The following portions of the chart were reviewed this encounter and updated as appropriate:   Tobacco  Allergies  Meds  Problems  Med Hx  Surg Hx  Fam Hx     Review of Systems:  No other skin or systemic complaints except as noted in HPI or Assessment and Plan.  Objective  Well appearing patient in no apparent distress; mood and affect are within normal limits.  A focused examination was performed including face, ears, neck. Relevant physical exam findings are noted in the Assessment and Plan.  neck and arms x 20 (20) Stuck on waxy paps with erythema  arms, neck Crusted macules   Assessment & Plan   History of PreCancerous Actinic Keratosis  - site(s) of PreCancerous Actinic Keratosis clear today. - these may recur and new lesions may form requiring treatment to prevent transformation into skin cancer - observe for new or changing spots and contact [place] if occur - photoprotection with sun protective clothing; sunglasses and broad spectrum sunscreen with SPF of at least 30 + and frequent self skin exams recommended - yearly exams by a dermatologist recommended for persons with history of PreCancerous Actinic Keratoses   Inflamed seborrheic keratosis (20) neck and arms x [facility] Prurigo [facility]odularis  Symptomatic, irritating, patient would like treated.  Destruction of lesion - neck and arms x 20 Complexity: simple   Destruction method: cryotherapy   Informed consent: discussed and consent obtained   Timeout:  patient name, date of birth, surgical site, and procedure verified Lesion destroyed using liquid nitrogen: Yes    Region frozen until ice ball extended beyond lesion: Yes   Outcome: patient tolerated procedure well with no complications   Post-procedure details: wound care instructions given    Lichen simplex chronicus arms, neck With Prurigo Nodularis Chronic and persistent condition with duration or expected duration over one year. Condition is symptomatic / bothersome to patient. Not to goal.  Start Clobetasol cr qd to aa arms, neck until clear, then prn flares Start Mupirocin oint qd to aa arms, neck until sores healed Recommend N-acetylcysteine (NAC) 600 mg supplement three times per day to help with picking  Topical steroids (such as triamcinolone, fluocinolone, fluocinonide, mometasone, clobetasol, halobetasol, betamethasone, hydrocortisone) can cause thinning and lightening of the skin if they are used for too long in the same area. Your physician has selected the right strength medicine for your problem and area affected on the body. Please use your medication only as directed by your physician to prevent side effects.    clobetasol cream (TEMOVATE) 0.05 % - arms, neck Apply 1 Application topically as directed. Apply to itchy bumps on arms and neck until clear, then as needed for flares, avoid face, groin, axilla  Seborrheic Keratoses - Stuck-on, waxy, tan-brown papules and/or plaques  - Benign-appearing - Discussed benign etiology and prognosis. - Observe - Call for any changes  Return in about 3 months (around 04/07/2022) for ISK, LSC/Prurigo Nodularis/AK f/u.  I, SoOthelia PullingRMA, am acting as scribe for DaSarina SerMD . Documentation: I have reviewed the above documentation for accuracy and completeness, and I agree  with the above.  Sarina Ser, MD

## 2022-01-07 ENCOUNTER — Encounter: Payer: Self-pay | Admitting: Dermatology

## 2022-01-14 DIAGNOSIS — H9190 Unspecified hearing loss, unspecified ear: Secondary | ICD-10-CM | POA: Diagnosis not present

## 2022-01-14 DIAGNOSIS — H9191 Unspecified hearing loss, right ear: Secondary | ICD-10-CM | POA: Diagnosis not present

## 2022-01-14 DIAGNOSIS — Z87891 Personal history of nicotine dependence: Secondary | ICD-10-CM | POA: Diagnosis not present

## 2022-04-19 ENCOUNTER — Ambulatory Visit: Payer: PPO | Admitting: Dermatology

## 2022-04-19 DIAGNOSIS — L281 Prurigo nodularis: Secondary | ICD-10-CM

## 2022-04-19 DIAGNOSIS — L578 Other skin changes due to chronic exposure to nonionizing radiation: Secondary | ICD-10-CM | POA: Diagnosis not present

## 2022-04-19 DIAGNOSIS — L820 Inflamed seborrheic keratosis: Secondary | ICD-10-CM | POA: Diagnosis not present

## 2022-04-19 DIAGNOSIS — Z872 Personal history of diseases of the skin and subcutaneous tissue: Secondary | ICD-10-CM

## 2022-04-19 DIAGNOSIS — L821 Other seborrheic keratosis: Secondary | ICD-10-CM

## 2022-04-19 DIAGNOSIS — L280 Lichen simplex chronicus: Secondary | ICD-10-CM

## 2022-04-19 NOTE — Patient Instructions (Signed)
Due to recent changes in healthcare laws, you may see results of your pathology and/or laboratory studies on MyChart before the doctors have had a chance to review them. We understand that in some cases there may be results that are confusing or concerning to you. Please understand that not all results are received at the same time and often the doctors may need to interpret multiple results in order to provide you with the best plan of care or course of treatment. Therefore, we ask that you please give us 2 business days to thoroughly review all your results before contacting the office for clarification. Should we see a critical lab result, you will be contacted sooner.   If You Need Anything After Your Visit  If you have any questions or concerns for your doctor, please call our main line at [phone] and press option 4 to reach your doctor's medical assistant. If no one answers, please leave a voicemail as directed and we will return your call as soon as possible. Messages left after 4 pm will be answered the following business day.   You may also send us a message via MyChart. We typically respond to MyChart messages within 1-2 business days.  For prescription refills, please ask your pharmacy to contact our office. Our fax number is [phone].  If you have an urgent issue when the clinic is closed that cannot wait until the next business day, you can page your doctor at the number below.    Please note that while we do our best to be available for urgent issues outside of office hours, we are not available 24/7.   If you have an urgent issue and are unable to reach us, you may choose to seek medical care at your doctor's office, retail clinic, urgent care center, or emergency room.  If you have a medical emergency, please immediately call 911 or go to the emergency department.  Pager Numbers  - Dr. Kowalski: [phone]  - Dr. Moye: [phone]  - Dr. Stewart:  [phone]  In the event of inclement weather, please call our main line at [phone] for an update on the status of any delays or closures.  Dermatology Medication Tips: Please keep the boxes that topical medications come in in order to help keep track of the instructions about where and how to use these. Pharmacies typically print the medication instructions only on the boxes and not directly on the medication tubes.   If your medication is too expensive, please contact our office at [phone] option 4 or send us a message through MyChart.   We are unable to tell what your co-pay for medications will be in advance as this is different depending on your insurance coverage. However, we may be able to find a substitute medication at lower cost or fill out paperwork to get insurance to cover a needed medication.   If a prior authorization is required to get your medication covered by your insurance company, please allow us 1-2 business days to complete this process.  Drug prices often vary depending on where the prescription is filled and some pharmacies may offer cheaper prices.  The website www.goodrx.com contains coupons for medications through different pharmacies. The prices here do not account for what the cost may be with help from insurance (it may be cheaper with your insurance), but the website can give you the price if you did not use any insurance.  - You can print the associated coupon and take it with   your prescription to the pharmacy.  - You may also stop by our office during regular business hours and pick up a GoodRx coupon card.  - If you need your prescription sent electronically to a different pharmacy, notify our office through [facility] MyChart or by phone at [phone] option 4.     Si Usted Necesita Algo Despus de Su Visita  Tambin puede enviarnos un mensaje a travs de MyChart. Por lo general respondemos a los mensajes de MyChart en el transcurso de 1 a 2  das hbiles.  Para renovar recetas, por favor pida a su farmacia que se ponga en contacto con nuestra oficina. Nuestro nmero de fax es el [phone].  Si tiene un asunto urgente cuando la clnica est cerrada y que no puede esperar hasta el siguiente da hbil, puede llamar/localizar a su doctor(a) al nmero que aparece a continuacin.   Por favor, tenga en cuenta que aunque hacemos todo lo posible para estar disponibles para asuntos urgentes fuera del horario de oficina, no estamos disponibles las 24 horas del da, los 7 das de la semana.   Si tiene un problema urgente y no puede comunicarse con nosotros, puede optar por buscar atencin mdica  en el consultorio de su doctor(a), en una clnica privada, en un centro de atencin urgente o en una sala de emergencias.  Si tiene una emergencia mdica, por favor llame inmediatamente al 911 o vaya a la sala de emergencias.  Nmeros de bper  - Dr. Kowalski: [phone]  - Dra. Moye: [phone]  - Dra. Stewart: [phone]  En caso de inclemencias del tiempo, por favor llame a nuestra lnea principal al [phone] para una actualizacin sobre el estado de cualquier retraso o cierre.  Consejos para la medicacin en dermatologa: Por favor, guarde las cajas en las que vienen los medicamentos de uso tpico para ayudarle a seguir las instrucciones sobre dnde y cmo usarlos. Las farmacias generalmente imprimen las instrucciones del medicamento slo en las cajas y no directamente en los tubos del medicamento.   Si su medicamento es muy caro, por favor, pngase en contacto con nuestra oficina llamando al [phone] y presione la opcin 4 o envenos un mensaje a travs de MyChart.   No podemos decirle cul ser su copago por los medicamentos por adelantado ya que esto es diferente dependiendo de la cobertura de su seguro. Sin embargo, es posible que podamos encontrar un medicamento sustituto a menor costo o llenar un formulario para que el  seguro cubra el medicamento que se considera necesario.   Si se requiere una autorizacin previa para que su compaa de seguros cubra su medicamento, por favor permtanos de 1 a 2 das hbiles para completar este proceso.  Los precios de los medicamentos varan con frecuencia dependiendo del lugar de dnde se surte la receta y alguna farmacias pueden ofrecer precios ms baratos.  El sitio web www.goodrx.com tiene cupones para medicamentos de diferentes farmacias. Los precios aqu no tienen en cuenta lo que podra costar con la ayuda del seguro (puede ser ms barato con su seguro), pero el sitio web puede darle el precio si no utiliz ningn seguro.  - Puede imprimir el cupn correspondiente y llevarlo con su receta a la farmacia.  - Tambin puede pasar por nuestra oficina durante el horario de atencin regular y recoger una tarjeta de cupones de GoodRx.  - Si necesita que su receta se enve electrnicamente a una farmacia diferente, informe a nuestra oficina a travs de MyChart de [place] [facility]   o por telfono llamando al [phone] y presione la opcin 4.  

## 2022-04-19 NOTE — Progress Notes (Signed)
   Follow-Up Visit   Subjective  Michael Stone. is a 86 y.o. male who presents for the following: ISKs with LSC (Neck, arms, 61mf/u), LSC with Prurigo Nodularis (Arms, neck,364m/u, not using Clobetasol cr or Mupirocin oint), and Actinic Keratosis (Face, 28m92mu). The patient has spots, moles and lesions to be evaluated, some may be new or changing and the patient has concerns that these could be cancer.  The following portions of the chart were reviewed this encounter and updated as appropriate:   Tobacco  Allergies  Meds  Problems  Med Hx  Surg Hx  Fam Hx     Review of Systems:  No other skin or systemic complaints except as noted in HPI or Assessment and Plan.  Objective  Well appearing patient in no apparent distress; mood and affect are within normal limits.  A focused examination was performed including face, arms, neck. Relevant physical exam findings are noted in the Assessment and Plan.  arms x 20 +, neck x 2 (22) Stuck on waxy paps with erythema  arms, neck Crusted macules/paps arms, neck          Assessment & Plan  Inflamed seborrheic keratosis (22) arms x 20 +, neck x 2 With Prurigo Nodules Symptomatic, irritating, patient would like treated. Destruction of lesion - arms x 20 +, neck x 2 Complexity: simple   Destruction method: cryotherapy   Informed consent: discussed and consent obtained   Timeout:  patient name, date of birth, surgical site, and procedure verified Lesion destroyed using liquid nitrogen: Yes   Region frozen until ice ball extended beyond lesion: Yes   Outcome: patient tolerated procedure well with no complications   Post-procedure details: wound care instructions given    Lichen simplex chronicus arms, neck With Prurigo Nodularis Discussed Dupixent, pt declines Discussed [place] Kenalog injections, pt declines Will Discuss treatment options again on f/u  Related Medications clobetasol cream (TEMOVATE) 0.06.64Apply 1  Application topically as directed. Apply to itchy bumps on arms and neck until clear, then as needed for flares, avoid face, groin, axilla  History of PreCancerous Actinic Keratosis  - site(s) of PreCancerous Actinic Keratosis clear today. - these may recur and new lesions may form requiring treatment to prevent transformation into skin cancer - observe for new or changing spots and contact [facility] appointment if occur - photoprotection with sun protective clothing; sunglasses and broad spectrum sunscreen with SPF of at least 30 + and frequent self skin exams recommended - yearly exams by a dermatologist recommended for persons with history of PreCancerous Actinic Keratoses   Actinic Damage - chronic, secondary to cumulative UV radiation exposure/sun exposure over time - diffuse scaly erythematous macules with underlying dyspigmentation - Recommend daily broad spectrum sunscreen SPF 30+ to sun-exposed areas, reapply every 2 hours as needed.  - Recommend staying in the shade or wearing long sleeves, sun glasses (UVA+UVB protection) and wide brim hats (4-inch brim around the entire circumference of the hat). - Call for new or changing lesions.  Return in about 3 months (around 07/20/2022) for ISK with Prurigo Nodules, LSC with Prurigo Nodules.  I, SonOthelia PullingMA, am acting as scribe for DavSarina SerD . Documentation: I have reviewed the above documentation for accuracy and completeness, and I agree with the above.  DavSarina SerD

## 2022-05-02 ENCOUNTER — Encounter: Payer: Self-pay | Admitting: Dermatology

## 2022-06-15 DIAGNOSIS — S46812A Strain of other muscles, fascia and tendons at shoulder and upper arm level, left arm, initial encounter: Secondary | ICD-10-CM | POA: Diagnosis not present

## 2022-07-27 ENCOUNTER — Ambulatory Visit: Payer: PPO | Admitting: Dermatology

## 2022-08-03 ENCOUNTER — Ambulatory Visit: Payer: PPO | Admitting: Dermatology

## 2022-08-03 VITALS — HR 71

## 2022-08-03 DIAGNOSIS — L820 Inflamed seborrheic keratosis: Secondary | ICD-10-CM | POA: Diagnosis not present

## 2022-08-03 DIAGNOSIS — L578 Other skin changes due to chronic exposure to nonionizing radiation: Secondary | ICD-10-CM

## 2022-08-03 DIAGNOSIS — L280 Lichen simplex chronicus: Secondary | ICD-10-CM

## 2022-08-03 DIAGNOSIS — L821 Other seborrheic keratosis: Secondary | ICD-10-CM | POA: Diagnosis not present

## 2022-08-03 DIAGNOSIS — Z872 Personal history of diseases of the skin and subcutaneous tissue: Secondary | ICD-10-CM | POA: Diagnosis not present

## 2022-08-03 NOTE — Patient Instructions (Addendum)
 Cryotherapy Aftercare  Wash gently with soap and water everyday.   Apply Vaseline and Band-Aid daily until healed. Due to recent changes in healthcare laws, you may see results of your pathology and/or laboratory studies on MyChart before the doctors have had a chance to review them. We understand that in some cases there may be results that are confusing or concerning to you. Please understand that not all results are received at the same time and often the doctors may need to interpret multiple results in order to provide you with the best plan of care or course of treatment. Therefore, we ask that you please give us 2 business days to thoroughly review all your results before contacting the office for clarification. Should we see a critical lab result, you will be contacted sooner.   If You Need Anything After Your Visit  If you have any questions or concerns for your doctor, please call our main line at [phone] and press option 4 to reach your doctor's medical assistant. If no one answers, please leave a voicemail as directed and we will return your call as soon as possible. Messages left after 4 pm will be answered the following business day.   You may also send us a message via MyChart. We typically respond to MyChart messages within 1-2 business days.  For prescription refills, please ask your pharmacy to contact our office. Our fax number is [phone].  If you have an urgent issue when the clinic is closed that cannot wait until the next business day, you can page your doctor at the number below.    Please note that while we do our best to be available for urgent issues outside of office hours, we are not available 24/7.   If you have an urgent issue and are unable to reach us, you may choose to seek medical care at your doctor's office, retail clinic, urgent care center, or emergency room.  If you have a medical emergency, please immediately call 911 or go to the emergency  department.  Pager Numbers  - Dr. Kowalski: [phone]  - Dr. Moye: [phone]  - Dr. Stewart: [phone]  In the event of inclement weather, please call our main line at [phone] for an update on the status of any delays or closures.  Dermatology Medication Tips: Please keep the boxes that topical medications come in in order to help keep track of the instructions about where and how to use these. Pharmacies typically print the medication instructions only on the boxes and not directly on the medication tubes.   If your medication is too expensive, please contact our office at [phone] option 4 or send us a message through MyChart.   We are unable to tell what your co-pay for medications will be in advance as this is different depending on your insurance coverage. However, we may be able to find a substitute medication at lower cost or fill out paperwork to get insurance to cover a needed medication.   If a prior authorization is required to get your medication covered by your insurance company, please allow us 1-2 business days to complete this process.  Drug prices often vary depending on where the prescription is filled and some pharmacies may offer cheaper prices.  The website www.goodrx.com contains coupons for medications through different pharmacies. The prices here do not account for what the cost may be with help from insurance (it may be cheaper with your insurance), but the website can give you the   price if you did not use any insurance.  - You can print the associated coupon and take it with your prescription to the pharmacy.  - You may also stop by our office during regular business hours and pick up a GoodRx coupon card.  - If you need your prescription sent electronically to a different pharmacy, notify our office through [facility] MyChart or by phone at [phone] option 4.     Si Usted Necesita Algo Despus de Su Visita  Tambin puede enviarnos un  mensaje a travs de MyChart. Por lo general respondemos a los mensajes de MyChart en el transcurso de 1 a 2 das hbiles.  Para renovar recetas, por favor pida a su farmacia que se ponga en contacto con nuestra oficina. Nuestro nmero de fax es el [phone].  Si tiene un asunto urgente cuando la clnica est cerrada y que no puede esperar hasta el siguiente da hbil, puede llamar/localizar a su doctor(a) al nmero que aparece a continuacin.   Por favor, tenga en cuenta que aunque hacemos todo lo posible para estar disponibles para asuntos urgentes fuera del horario de oficina, no estamos disponibles las 24 horas del da, los 7 das de la semana.   Si tiene un problema urgente y no puede comunicarse con nosotros, puede optar por buscar atencin mdica  en el consultorio de su doctor(a), en una clnica privada, en un centro de atencin urgente o en una sala de emergencias.  Si tiene una emergencia mdica, por favor llame inmediatamente al 911 o vaya a la sala de emergencias.  Nmeros de bper  - Dr. Kowalski: [phone]  - Dra. Moye: [phone]  - Dra. Stewart: [phone]  En caso de inclemencias del tiempo, por favor llame a nuestra lnea principal al [phone] para una actualizacin sobre el estado de cualquier retraso o cierre.  Consejos para la medicacin en dermatologa: Por favor, guarde las cajas en las que vienen los medicamentos de uso tpico para ayudarle a seguir las instrucciones sobre dnde y cmo usarlos. Las farmacias generalmente imprimen las instrucciones del medicamento slo en las cajas y no directamente en los tubos del medicamento.   Si su medicamento es muy caro, por favor, pngase en contacto con nuestra oficina llamando al [phone] y presione la opcin 4 o envenos un mensaje a travs de MyChart.   No podemos decirle cul ser su copago por los medicamentos por adelantado ya que esto es diferente dependiendo de la cobertura de su seguro. Sin embargo,  es posible que podamos encontrar un medicamento sustituto a menor costo o llenar un formulario para que el seguro cubra el medicamento que se considera necesario.   Si se requiere una autorizacin previa para que su compaa de seguros cubra su medicamento, por favor permtanos de 1 a 2 das hbiles para completar este proceso.  Los precios de los medicamentos varan con frecuencia dependiendo del lugar de dnde se surte la receta y alguna farmacias pueden ofrecer precios ms baratos.  El sitio web www.goodrx.com tiene cupones para medicamentos de diferentes farmacias. Los precios aqu no tienen en cuenta lo que podra costar con la ayuda del seguro (puede ser ms barato con su seguro), pero el sitio web puede darle el precio si no utiliz ningn seguro.  - Puede imprimir el cupn correspondiente y llevarlo con su receta a la farmacia.  - Tambin puede pasar por nuestra oficina durante el horario de atencin regular y recoger una tarjeta de cupones de GoodRx.  - Si necesita que   su receta se enve electrnicamente a una farmacia diferente, informe a nuestra oficina a travs de MyChart de [facility] o por telfono llamando al [phone] y presione la opcin 4.  

## 2022-08-03 NOTE — Progress Notes (Signed)
   Follow-Up Visit   Subjective  Michael Stone. is a 87 y.o. male who presents for the following: ISKs with Prurigo Nodules (Arms, neck, 3mf/u, LN2) and check spots (Back, itchy). The patient has spots, moles and lesions to be evaluated, some may be new or changing and the patient has concerns that these could be cancer.  The following portions of the chart were reviewed this encounter and updated as appropriate:   Tobacco  Allergies  Meds  Problems  Med Hx  Surg Hx  Fam Hx     Review of Systems:  No other skin or systemic complaints except as noted in HPI or Assessment and Plan.  Objective  Well appearing patient in no apparent distress; mood and affect are within normal limits.  A focused examination was performed including face, arms, neck, back. Relevant physical exam findings are noted in the Assessment and Plan.  back x 19 (19) Stuck on waxy paps with erythema   arms, neck Clear today   Assessment & Plan   History of PreCancerous Actinic Keratosis  - site(s) of PreCancerous Actinic Keratosis clear today. - these may recur and new lesions may form requiring treatment to prevent transformation into skin cancer - observe for new or changing spots and [facility] if [facility] - photoprotection with sun protective clothing; sunglasses and broad spectrum sunscreen with SPF of at least 30 + and frequent self skin exams recommended - yearly exams by a dermatologist recommended for persons with history of PreCancerous Actinic Keratoses   Seborrheic Keratoses - Stuck-on, waxy, tan-brown papules and/or plaques  - Benign-appearing - Discussed benign etiology and prognosis. - Observe - Call for any changes  Actinic Damage - chronic, secondary to cumulative UV radiation exposure/sun exposure over time - diffuse scaly erythematous macules with underlying dyspigmentation - Recommend daily broad spectrum sunscreen SPF 30+ to sun-exposed  areas, reapply every 2 hours as needed.  - Recommend staying in the shade or wearing long sleeves, sun glasses (UVA+UVB protection) and wide brim hats (4-inch brim around the entire circumference of the hat). - Call for new or changing lesions.   Inflamed seborrheic keratosis (19) back x 19 Symptomatic, irritating, patient would like treated. Destruction of lesion - back x 19 Complexity: simple   Destruction method: cryotherapy   Informed consent: discussed and consent obtained   Timeout:  patient name, date of birth, surgical site, and procedure verified Lesion destroyed using liquid nitrogen: Yes   Region frozen until ice ball extended beyond lesion: Yes   Outcome: patient tolerated procedure well with no complications   Post-procedure details: wound care instructions given    Lichen simplex chronicus arms, neck With Prurigo Nodularis, resolved Related Medications clobetasol cream (TEMOVATE) [id number]% Apply 1 Application topically as directed. Apply to itchy bumps on arms and neck until clear, then as needed for flares, avoid face, groin, axilla   Return for 3-438mor ISK f/u, hx of LSC with Prurigo nodules arms, back.  I, SoOthelia PullingRMA, am acting as scribe for DaSarina SerMD . Documentation: I have reviewed the above documentation for accuracy and completeness, and I agree with the above.  DaSarina SerMD

## 2022-08-11 ENCOUNTER — Encounter: Payer: Self-pay | Admitting: Dermatology

## 2022-08-28 DIAGNOSIS — J019 Acute sinusitis, unspecified: Secondary | ICD-10-CM | POA: Diagnosis not present

## 2022-09-02 DIAGNOSIS — C61 Malignant neoplasm of prostate: Secondary | ICD-10-CM | POA: Diagnosis not present

## 2022-09-02 DIAGNOSIS — Z87891 Personal history of nicotine dependence: Secondary | ICD-10-CM | POA: Diagnosis not present

## 2022-09-02 DIAGNOSIS — H9190 Unspecified hearing loss, unspecified ear: Secondary | ICD-10-CM | POA: Diagnosis not present

## 2022-09-02 DIAGNOSIS — H9191 Unspecified hearing loss, right ear: Secondary | ICD-10-CM | POA: Diagnosis not present

## 2022-09-21 ENCOUNTER — Telehealth: Payer: Self-pay | Admitting: Family Medicine

## 2022-09-21 NOTE — Telephone Encounter (Signed)
Contacted Michael Stone. to schedule their annual wellness visit. Patient declined to schedule AWV at this time. Wants only visits with provider   *Verlee Rossetti; Care Guide Ambulatory Clinical Support [place] [facility] Direct Dial: [phone]

## 2022-11-30 ENCOUNTER — Ambulatory Visit: Payer: PPO | Admitting: Dermatology

## 2023-04-19 DIAGNOSIS — S91332A Puncture wound without foreign body, left foot, initial encounter: Secondary | ICD-10-CM | POA: Diagnosis not present

## 2023-06-21 DIAGNOSIS — D2372 Other benign neoplasm of skin of left lower limb, including hip: Secondary | ICD-10-CM | POA: Diagnosis not present

## 2024-03-06 DIAGNOSIS — Z0130 Encounter for examination of blood pressure without abnormal findings: Secondary | ICD-10-CM | POA: Diagnosis not present
